# Patient Record
Sex: Female | Born: 1970 | Race: Black or African American | Hispanic: No | Marital: Single | Smoking: Never smoker
Health system: Southern US, Community
[De-identification: ages and names within clinical notes are randomized; demographics above are authoritative.]

## PROBLEM LIST (undated history)

## (undated) DIAGNOSIS — G43909 Migraine, unspecified, not intractable, without status migrainosus: Secondary | ICD-10-CM

## (undated) DIAGNOSIS — L309 Dermatitis, unspecified: Secondary | ICD-10-CM

## (undated) DIAGNOSIS — R7303 Prediabetes: Secondary | ICD-10-CM

## (undated) DIAGNOSIS — T7840XA Allergy, unspecified, initial encounter: Secondary | ICD-10-CM

## (undated) DIAGNOSIS — M1990 Unspecified osteoarthritis, unspecified site: Secondary | ICD-10-CM

## (undated) HISTORY — DX: Unspecified osteoarthritis, unspecified site: M19.90

## (undated) HISTORY — DX: Allergy, unspecified, initial encounter: T78.40XA

## (undated) HISTORY — DX: Prediabetes: R73.03

## (undated) HISTORY — DX: Dermatitis, unspecified: L30.9

## (undated) HISTORY — DX: Migraine, unspecified, not intractable, without status migrainosus: G43.909

## (undated) HISTORY — PX: BREAST BIOPSY: SHX20

## (undated) HISTORY — PX: SPINE SURGERY: SHX786

## (undated) HISTORY — PX: TUBAL LIGATION: SHX77

---

## 2017-08-28 LAB — HM HEPATITIS C SCREENING LAB: HM Hepatitis Screen: NEGATIVE

## 2017-08-28 LAB — HM HIV SCREENING LAB: HM HIV Screening: NEGATIVE

## 2020-09-03 LAB — HM PAP SMEAR: HM Pap smear: NORMAL

## 2020-09-25 ENCOUNTER — Other Ambulatory Visit: Payer: Self-pay | Admitting: Physician Assistant

## 2020-09-25 ENCOUNTER — Ambulatory Visit
Admission: RE | Admit: 2020-09-25 | Discharge: 2020-09-25 | Disposition: A | Discharge status: Home / Self Care | Payer: Managed Care, Other (non HMO) | Source: Ambulatory Visit | Attending: Physician Assistant | Admitting: Physician Assistant

## 2020-09-25 DIAGNOSIS — R053 Chronic cough: Secondary | ICD-10-CM

## 2021-03-20 ENCOUNTER — Emergency Department (HOSPITAL_COMMUNITY): Payer: Managed Care, Other (non HMO)

## 2021-03-20 ENCOUNTER — Emergency Department (HOSPITAL_COMMUNITY)
Admission: EM | Admit: 2021-03-20 | Discharge: 2021-03-21 | Disposition: A | Discharge status: Home / Self Care | Payer: Managed Care, Other (non HMO) | Attending: Emergency Medicine | Admitting: Emergency Medicine

## 2021-03-20 ENCOUNTER — Encounter (HOSPITAL_COMMUNITY): Payer: Self-pay

## 2021-03-20 ENCOUNTER — Other Ambulatory Visit: Payer: Self-pay

## 2021-03-20 DIAGNOSIS — R079 Chest pain, unspecified: Secondary | ICD-10-CM | POA: Diagnosis not present

## 2021-03-20 DIAGNOSIS — Z5321 Procedure and treatment not carried out due to patient leaving prior to being seen by health care provider: Secondary | ICD-10-CM | POA: Insufficient documentation

## 2021-03-20 DIAGNOSIS — R42 Dizziness and giddiness: Secondary | ICD-10-CM | POA: Insufficient documentation

## 2021-03-20 DIAGNOSIS — R202 Paresthesia of skin: Secondary | ICD-10-CM | POA: Diagnosis not present

## 2021-03-20 LAB — CBC
HCT: 41.1 % (ref 36.0–46.0)
Hemoglobin: 13.3 g/dL (ref 12.0–15.0)
MCH: 29 pg (ref 26.0–34.0)
MCHC: 32.4 g/dL (ref 30.0–36.0)
MCV: 89.7 fL (ref 80.0–100.0)
Platelets: 227 10*3/uL (ref 150–400)
RBC: 4.58 MIL/uL (ref 3.87–5.11)
RDW: 13.9 % (ref 11.5–15.5)
WBC: 7.4 10*3/uL (ref 4.0–10.5)
nRBC: 0 % (ref 0.0–0.2)

## 2021-03-20 LAB — I-STAT BETA HCG BLOOD, ED (MC, WL, AP ONLY): I-stat hCG, quantitative: <5 m[IU]/mL (ref <5)

## 2021-03-20 NOTE — ED Triage Notes (Signed)
Midsternal chest pain with dizziness, left foot tingling today but has been "going on for a while now."    Denies any pain.

## 2021-03-21 LAB — BASIC METABOLIC PANEL
Anion gap: 7 (ref 5–15)
BUN: 12 mg/dL (ref 6–20)
CO2: 26 mmol/L (ref 22–32)
Calcium: 8.9 mg/dL (ref 8.9–10.3)
Chloride: 103 mmol/L (ref 98–111)
Creatinine, Ser: 0.75 mg/dL (ref 0.44–1.00)
GFR, Estimated: >60 mL/min (ref >60)
Glucose, Bld: 84 mg/dL (ref 70–99)
Potassium: 4.2 mmol/L (ref 3.5–5.1)
Sodium: 136 mmol/L (ref 135–145)

## 2021-03-21 LAB — TROPONIN I (HIGH SENSITIVITY)
Troponin I (High Sensitivity): 3 ng/L (ref <18)
Troponin I (High Sensitivity): 5 ng/L (ref <18)

## 2021-03-21 NOTE — ED Notes (Signed)
Patient states d/t wait she is leaving

## 2021-03-26 ENCOUNTER — Other Ambulatory Visit: Payer: Self-pay

## 2021-03-26 ENCOUNTER — Encounter (HOSPITAL_BASED_OUTPATIENT_CLINIC_OR_DEPARTMENT_OTHER): Payer: Self-pay

## 2021-03-26 ENCOUNTER — Emergency Department (HOSPITAL_BASED_OUTPATIENT_CLINIC_OR_DEPARTMENT_OTHER)
Admission: EM | Admit: 2021-03-26 | Discharge: 2021-03-26 | Disposition: A | Discharge status: Home / Self Care | Payer: Managed Care, Other (non HMO) | Attending: Emergency Medicine | Admitting: Emergency Medicine

## 2021-03-26 DIAGNOSIS — R519 Headache, unspecified: Secondary | ICD-10-CM | POA: Diagnosis present

## 2021-03-26 DIAGNOSIS — G43809 Other migraine, not intractable, without status migrainosus: Secondary | ICD-10-CM

## 2021-03-26 MED ORDER — SODIUM CHLORIDE 0.9 % IV BOLUS
1000.0000 mL | Freq: Once | INTRAVENOUS | Status: AC
  Administered 2021-03-26: 1000 mL via INTRAVENOUS

## 2021-03-26 MED ORDER — DIPHENHYDRAMINE HCL 50 MG/ML IJ SOLN
25.0000 mg | Freq: Once | INTRAMUSCULAR | Status: AC
  Administered 2021-03-26: 25 mg via INTRAVENOUS
  Filled 2021-03-26: qty 1

## 2021-03-26 MED ORDER — PROCHLORPERAZINE EDISYLATE 10 MG/2ML IJ SOLN
10.0000 mg | Freq: Once | INTRAMUSCULAR | Status: AC
  Administered 2021-03-26: 10 mg via INTRAVENOUS
  Filled 2021-03-26: qty 2

## 2021-03-26 MED ORDER — DEXAMETHASONE SODIUM PHOSPHATE 10 MG/ML IJ SOLN
10.0000 mg | Freq: Once | INTRAMUSCULAR | Status: AC
  Administered 2021-03-26: 10 mg via INTRAVENOUS
  Filled 2021-03-26: qty 1

## 2021-03-26 NOTE — ED Triage Notes (Signed)
Pt reports headache that goes to her neck - onset this morning   Denies  dizzy / nausea / changes in vision / loss of balance

## 2021-03-26 NOTE — ED Provider Notes (Signed)
[facility] EMERGENCY DEPT Provider Note   CSN: [id number] Arrival date & time: 03/26/21  1938     History Chief Complaint  Patient presents with   Headache    Megan Frederick is a 50 y.o. female.  The history is provided by the patient.  Headache Pain location:  Generalized Radiates to:  Does not radiate Onset quality:  Gradual Timing:  Constant Progression:  Unchanged Chronicity:  Recurrent Similar to prior headaches: yes   Relieved by:  Nothing Worsened by:  Nothing Associated symptoms: no abdominal pain, no back pain, no blurred vision, no congestion, no cough, no diarrhea, no dizziness, no drainage, no ear pain, no eye pain, no facial pain, no fatigue, no fever, no focal weakness, no hearing loss, no loss of balance, no myalgias, no nausea, no near-syncope, no neck pain, no neck stiffness, no numbness, no paresthesias, no photophobia, no seizures, no sinus pressure, no sore throat, no swollen glands, no syncope, no tingling, no URI, no visual change, no vomiting and no weakness       History reviewed. No pertinent past medical history.  There are no problems to display for this patient.   Past Surgical History:  Procedure Laterality Date   TUBAL LIGATION       OB History   No obstetric history on file.     History reviewed. No pertinent family history.  Social History   Tobacco Use   Smoking status: Never   Smokeless tobacco: Never  Substance Use Topics   Alcohol use: Not Currently   Drug use: Never    Home Medications Prior to Admission medications   Not on File    Allergies    Patient has no known allergies.  Review of Systems   Review of Systems  Constitutional:  Negative for chills, fatigue and fever.  HENT:  Negative for congestion, ear pain, hearing loss, postnasal drip, sinus pressure and sore throat.   Eyes:  Negative for blurred vision, photophobia, pain and visual disturbance.  Respiratory:  Negative for cough  and shortness of breath.   Cardiovascular:  Negative for chest pain, palpitations, syncope and near-syncope.  Gastrointestinal:  Negative for abdominal pain, diarrhea, nausea and vomiting.  Genitourinary:  Negative for dysuria and hematuria.  Musculoskeletal:  Negative for arthralgias, back pain, myalgias, neck pain and neck stiffness.  Skin:  Negative for color change and rash.  Neurological:  Positive for headaches. Negative for dizziness, focal weakness, seizures, syncope, weakness, numbness, paresthesias and loss of balance.  All other systems reviewed and are negative.  Physical Exam Updated Vital Signs BP 124/78 (BP Location: Left Arm)   Pulse 70   Temp 98.2 F (36.8 C) (Oral)   Resp 18   Ht 5\' 5"  (1.651 m)   Wt 104.3 kg   SpO2 100%   BMI 38.27 kg/m   Physical Exam Vitals and nursing note reviewed.  Constitutional:      General: She is not in acute distress.    Appearance: She is well-developed. She is not ill-appearing.  HENT:     Head: Normocephalic and atraumatic.     Mouth/Throat:     Mouth: Mucous membranes are moist.  Eyes:     General: No visual field deficit.    Extraocular Movements: Extraocular movements intact.     Right eye: Normal extraocular motion.     Left eye: Normal extraocular motion.     Conjunctiva/sclera: Conjunctivae normal.     Pupils: Pupils are equal, round, and reactive to  light.  Cardiovascular:     Rate and Rhythm: Normal rate and regular rhythm.     Pulses: Normal pulses.     Heart sounds: Normal heart sounds. No murmur heard. Pulmonary:     Effort: Pulmonary effort is normal. No respiratory distress.     Breath sounds: Normal breath sounds.  Abdominal:     Palpations: Abdomen is soft.     Tenderness: There is no abdominal tenderness.  Musculoskeletal:     Cervical back: Neck supple.  Skin:    General: Skin is warm and dry.     Capillary Refill: Capillary refill takes less than 2 seconds.  Neurological:     General: No focal  deficit present.     Mental Status: She is alert and oriented to person, place, and time.     Cranial Nerves: No cranial nerve deficit, dysarthria or facial asymmetry.     Sensory: No sensory deficit.     Motor: No weakness.     Coordination: Coordination normal.    ED Results / Procedures / Treatments   Labs (all labs ordered are listed, but only abnormal results are displayed) Labs Reviewed - No data to display  EKG None  Radiology No results found.  Procedures Procedures   Medications Ordered in ED Medications  prochlorperazine (COMPAZINE) injection 10 mg (10 mg Intravenous Given 03/26/21 2219)  diphenhydrAMINE (BENADRYL) injection 25 mg (25 mg Intravenous Given 03/26/21 2219)  sodium chloride 0.9 % bolus 1,000 mL (1,000 mLs Intravenous New Bag/Given 03/26/21 2219)  dexamethasone (DECADRON) injection 10 mg (10 mg Intravenous Given 03/26/21 2219)    ED Course  I have reviewed the triage vital signs and the nursing notes.  Pertinent labs & imaging results that were available during my care of the patient were reviewed by me and considered in my medical decision making (see chart for details).    MDM Rules/Calculators/A&P                           Megan Frederick is here with headache.  Normal vitals.  No fever.  History of migraines.  No concern for meningitis or stroke.  Neurological exam is normal.  She denies any infectious symptoms.  No COVID symptoms.  She took ibuprofen without much relief.  Was given headache cocktail here with great improvement.  Overall suspect migraine.  Recommend continued use of Tylenol and ibuprofen at home.  Understands return precautions.  Discharged in the ED in good condition.  This chart was dictated using voice recognition software.  Despite best efforts to proofread,  errors can occur which can change the documentation meaning.   Final Clinical Impression(s) / ED Diagnoses Final diagnoses:  Other migraine without status  migrainosus, not intractable    Rx / DC Orders ED Discharge Orders     None        Virgina Norfolk, DO 03/26/21 2257

## 2021-07-18 ENCOUNTER — Encounter (HOSPITAL_BASED_OUTPATIENT_CLINIC_OR_DEPARTMENT_OTHER): Payer: Self-pay

## 2021-07-18 ENCOUNTER — Other Ambulatory Visit: Payer: Self-pay

## 2021-07-18 ENCOUNTER — Emergency Department (HOSPITAL_BASED_OUTPATIENT_CLINIC_OR_DEPARTMENT_OTHER)
Admission: EM | Admit: 2021-07-18 | Discharge: 2021-07-18 | Disposition: A | Discharge status: Home / Self Care | Payer: Managed Care, Other (non HMO) | Attending: Emergency Medicine | Admitting: Emergency Medicine

## 2021-07-18 DIAGNOSIS — J3489 Other specified disorders of nose and nasal sinuses: Secondary | ICD-10-CM | POA: Insufficient documentation

## 2021-07-18 DIAGNOSIS — J029 Acute pharyngitis, unspecified: Secondary | ICD-10-CM | POA: Diagnosis present

## 2021-07-18 DIAGNOSIS — Z20822 Contact with and (suspected) exposure to covid-19: Secondary | ICD-10-CM | POA: Insufficient documentation

## 2021-07-18 LAB — RESP PANEL BY RT-PCR (FLU A&B, COVID) ARPGX2
Influenza A by PCR: NEGATIVE
Influenza B by PCR: NEGATIVE
SARS Coronavirus 2 by RT PCR: NEGATIVE

## 2021-07-18 LAB — GROUP A STREP BY PCR: Group A Strep by PCR: NOT DETECTED

## 2021-07-18 NOTE — Discharge Instructions (Signed)
You are seen here today for evaluation of your sore throat.  Your COVID, flu, and strep test were negative.  This is likely viral in nature.  You have been referred to [facility] health family medicine.  Please call the number listed on this discharge report to schedule an appointment.  Please continue your home remedies such as your salt gargles and warm tea.  If you have any concern, new or worsening symptoms, please return to the nearest emergency department.

## 2021-07-18 NOTE — ED Triage Notes (Signed)
Patient here POV from Home with Neck Swelling.  Patient states Swelling began approximately 2-3 days PTA and has worsened since.  NAD Noted during Triage. Patient speaking in complete sentences. A&Ox4. GCS 15. Ambulatory. No Fevers. No Oral Airway Compromise at this Time.

## 2021-07-18 NOTE — ED Provider Notes (Signed)
[facility] EMERGENCY DEPT Provider Note   CSN: [id number] Arrival date & time: 07/18/21  1339     History Chief Complaint  Patient presents with   Neck Swelling    Iris Bugett Kellum is a 50 y.o. by 47 female presents to the emergency department for 2 days of sore throat, nasal congestion, rhinorrhea, and lymphadenopathy.  Patient reports she is having pain with swallowing of her throat.  She is finding mild relief with salt gargles and warm tea.  She denies any fever, chills, chest pain, shortness of breath, headache, ear pain, drooling, problems controlling her secretions.  Patient reports she has this unilateral lymphadenopathy around once per year for the past few years.  Denies any medical history.  Not pertinent surgical history.  No daily medications.  No known drug allergies.  Denies any tobacco, EtOH, or drug use.  HPI     History reviewed. No pertinent past medical history.  There are no problems to display for this patient.   Past Surgical History:  Procedure Laterality Date   TUBAL LIGATION       OB History   No obstetric history on file.     No family history on file.  Social History   Tobacco Use   Smoking status: Never   Smokeless tobacco: Never  Substance Use Topics   Alcohol use: Not Currently   Drug use: Never    Home Medications Prior to Admission medications   Medication Sig Start Date End Date Taking? Authorizing Provider  Ascorbic Acid (VITAMIN C) 100 MG tablet Take 100 mg by mouth daily.   Yes [provider]  Vitamin D, Ergocalciferol, (DRISDOL) 1.25 MG (50000 UNIT) CAPS capsule Take 50,000 Units by mouth See admin instructions. Once a week   Yes [provider]    Allergies    Patient has no known allergies.  Review of Systems   Review of Systems  Constitutional:  Negative for chills and fever.  HENT:  Positive for congestion, rhinorrhea and sore throat. Negative for ear discharge and ear pain.    Eyes:  Negative for pain and visual disturbance.  Respiratory:  Negative for cough and shortness of breath.   Cardiovascular:  Negative for chest pain and palpitations.  Gastrointestinal:  Negative for abdominal pain and vomiting.  Genitourinary:  Negative for dysuria and hematuria.  Musculoskeletal:  Negative for arthralgias and back pain.  Skin:  Negative for color change and rash.  Neurological:  Negative for seizures and syncope.  Hematological:  Positive for adenopathy.  All other systems reviewed and are negative.  Physical Exam Updated Vital Signs BP 115/74 (BP Location: Left Arm)   Pulse 85   Temp 98.1 F (36.7 C) (Oral)   Resp 16   Ht 5\' 5"  (1.651 m)   Wt 104.3 kg   SpO2 99%   BMI 38.26 kg/m   Physical Exam Constitutional:      General: She is not in acute distress.    Appearance: Normal appearance. She is not toxic-appearing.  HENT:     Right Ear: Tympanic membrane, ear canal and external ear normal.     Left Ear: Tympanic membrane, ear canal and external ear normal.     Nose:     Comments: bilateral nasal erythema and edema with scant clear nasal discharge    Mouth/Throat:     Mouth: Mucous membranes are moist.     Pharynx: Oropharynx is clear. No oropharyngeal exudate.     Comments: Mild pharyngeal erythema,  without edema or exudate.  Tonsils normal.  Uvula midline.  Airway patent.  Patient speaking in full sentences with ease. Eyes:     General: No scleral icterus. Neck:     Comments: Small lymph node less than 2 cm to the patient's left submandibular region.  Mildly tender to palpation. Pulmonary:     Effort: Pulmonary effort is normal. No respiratory distress.  Musculoskeletal:     Cervical back: Normal range of motion. No rigidity.  Lymphadenopathy:     Cervical: Cervical adenopathy present.  Skin:    General: Skin is dry.     Findings: No rash.  Neurological:     General: No focal deficit present.     Mental Status: She is alert. Mental status is  at baseline.  Psychiatric:        Mood and Affect: Mood normal.    ED Results / Procedures / Treatments   Labs (all labs ordered are listed, but only abnormal results are displayed) Labs Reviewed  RESP PANEL BY RT-PCR (FLU A&B, COVID) ARPGX2  GROUP A STREP BY PCR    EKG None  Radiology No results found.  Procedures Procedures   Medications Ordered in ED Medications - No data to display  ED Course  I have reviewed the triage vital signs and the nursing notes.  Pertinent labs & imaging results that were available during my care of the patient were reviewed by me and considered in my medical decision making (see chart for details).  50 year old female presents to the emergency department with nasal congestion, rhinorrhea, and sore throat with lymphadenopathy for the past 2 days.  Patient had a negative COVID, flu, and strep.  I discussed with patient that this issue is likely viral in nature and will not require an antibiotic.  However, since she has this lymphadenopathy periodically over the past few years, I recommended that she follow-up with her primary care provider for further evaluation.  Physical exam unremarkable other than mild pharyngeal erythema.  Patient speaking in full sentence with ease.  Controlling secretions.  Full range of motion in neck.  We will refer her to primary care clinic.  Furthermore, I recommended that she keep up with her homeopathic remedies such as gargling with salt water and drinking warm tea.  Return precautions discussed.  Patient agrees to plan.  Patient is stable and being discharged home in good condition.    MDM Rules/Calculators/A&P                          Final Clinical Impression(s) / ED Diagnoses Final diagnoses:  Sore throat    Rx / DC Orders ED Discharge Orders     None        Achille Rich, PA-C 07/18/21 1633    Terald Sleeper, MD 07/19/21 [phone]

## 2021-09-19 ENCOUNTER — Ambulatory Visit: Payer: Managed Care, Other (non HMO) | Admitting: Allergy & Immunology

## 2021-11-05 ENCOUNTER — Emergency Department (HOSPITAL_BASED_OUTPATIENT_CLINIC_OR_DEPARTMENT_OTHER): Payer: Managed Care, Other (non HMO) | Admitting: Radiology

## 2021-11-05 ENCOUNTER — Other Ambulatory Visit: Payer: Self-pay

## 2021-11-05 ENCOUNTER — Emergency Department (HOSPITAL_BASED_OUTPATIENT_CLINIC_OR_DEPARTMENT_OTHER)
Admission: EM | Admit: 2021-11-05 | Discharge: 2021-11-05 | Disposition: A | Discharge status: Home / Self Care | Payer: Managed Care, Other (non HMO) | Attending: Emergency Medicine | Admitting: Emergency Medicine

## 2021-11-05 ENCOUNTER — Emergency Department (HOSPITAL_BASED_OUTPATIENT_CLINIC_OR_DEPARTMENT_OTHER): Payer: Managed Care, Other (non HMO)

## 2021-11-05 ENCOUNTER — Encounter (HOSPITAL_BASED_OUTPATIENT_CLINIC_OR_DEPARTMENT_OTHER): Payer: Self-pay

## 2021-11-05 DIAGNOSIS — R42 Dizziness and giddiness: Secondary | ICD-10-CM | POA: Insufficient documentation

## 2021-11-05 DIAGNOSIS — R202 Paresthesia of skin: Secondary | ICD-10-CM | POA: Diagnosis not present

## 2021-11-05 DIAGNOSIS — R200 Anesthesia of skin: Secondary | ICD-10-CM

## 2021-11-05 LAB — BASIC METABOLIC PANEL
Anion gap: 9 (ref 5–15)
BUN: 15 mg/dL (ref 6–20)
CO2: 27 mmol/L (ref 22–32)
Calcium: 9.2 mg/dL (ref 8.9–10.3)
Chloride: 105 mmol/L (ref 98–111)
Creatinine, Ser: 0.67 mg/dL (ref 0.44–1.00)
GFR, Estimated: >60 mL/min (ref >60)
Glucose, Bld: 113 mg/dL — ABNORMAL HIGH (ref 70–99)
Potassium: 3.8 mmol/L (ref 3.5–5.1)
Sodium: 141 mmol/L (ref 135–145)

## 2021-11-05 LAB — CBC
HCT: 39.1 % (ref 36.0–46.0)
Hemoglobin: 12.9 g/dL (ref 12.0–15.0)
MCH: 28.7 pg (ref 26.0–34.0)
MCHC: 33 g/dL (ref 30.0–36.0)
MCV: 86.9 fL (ref 80.0–100.0)
Platelets: 225 10*3/uL (ref 150–400)
RBC: 4.5 MIL/uL (ref 3.87–5.11)
RDW: 14.3 % (ref 11.5–15.5)
WBC: 6.6 10*3/uL (ref 4.0–10.5)
nRBC: 0 % (ref 0.0–0.2)

## 2021-11-05 LAB — TROPONIN I (HIGH SENSITIVITY): Troponin I (High Sensitivity): 4 ng/L (ref <18)

## 2021-11-05 NOTE — ED Notes (Signed)
Pt verbalizes understanding of discharge instructions. Opportunity for questioning and answers were provided. Pt discharged from ED to home.   ? ?

## 2021-11-05 NOTE — ED Provider Notes (Signed)
?[facility]PT ?Provider Note ? ? ?CSN: [id number] ?Arrival date & time: 11/05/21  0215 ? ?  ? ?History ? ?Chief Complaint  ?Patient presents with  ? Dizziness  ? ? ?Megan Frederick is a 51 y.o. female. ? ?Patient is a 51 year old female with no significant past medical history.  She presents today for evaluation of tingling in her left hand and dizziness.  This started yesterday evening in the absence of any injury or trauma.  She denies any headache, visual disturbances, or any tingling in her legs.  She denies any chest pain, difficulty breathing.  There are no aggravating or alleviating factors.  She also reports feeling light headed on occasion as well. ? ?The history is provided by the patient.  ?Dizziness ?Quality:  Lightheadedness ?Severity:  Moderate ?Timing:  Intermittent ?Progression:  Worsening ?Chronicity:  New ? ?  ? ?Home Medications ?Prior to Admission medications   ?Medication Sig Start Date End Date Taking? Authorizing Provider  ?Ascorbic Acid (VITAMIN C) 100 MG tablet Take 100 mg by mouth daily.    [provider]  ?Vitamin D, Ergocalciferol, (DRISDOL) 1.25 MG (50000 UNIT) CAPS capsule Take 50,000 Units by mouth See admin instructions. Once a week    [provider]  ?   ? ?Allergies    ?Patient has no known allergies.   ? ?Review of Systems   ?Review of Systems  ?Neurological:  Positive for dizziness.  ?All other systems reviewed and are negative. ? ?Physical Exam ?Updated Vital Signs ?BP 104/69   Pulse 71   Temp 98.3 ?F (36.8 ?C)   Resp 18   Ht 5\' 5"  (1.651 m)   Wt 104.3 kg   SpO2 98%   BMI 38.27 kg/m?  ?Physical Exam ?Vitals and nursing note reviewed.  ?Constitutional:   ?   General: She is not in acute distress. ?   Appearance: She is well-developed. She is not diaphoretic.  ?HENT:  ?   Head: Normocephalic and atraumatic.  ?   Mouth/Throat:  ?   Mouth: Mucous membranes are moist.  ?Eyes:  ?   Extraocular Movements: Extraocular movements  intact.  ?   Pupils: Pupils are equal, round, and reactive to light.  ?Cardiovascular:  ?   Rate and Rhythm: Normal rate and regular rhythm.  ?   Heart sounds: No murmur heard. ?  No friction rub. No gallop.  ?Pulmonary:  ?   Effort: Pulmonary effort is normal. No respiratory distress.  ?   Breath sounds: Normal breath sounds. No wheezing.  ?Abdominal:  ?   General: Bowel sounds are normal. There is no distension.  ?   Palpations: Abdomen is soft.  ?   Tenderness: There is no abdominal tenderness.  ?Musculoskeletal:     ?   General: Normal range of motion.  ?   Cervical back: Normal range of motion and neck supple.  ?Skin: ?   General: Skin is warm and dry.  ?Neurological:  ?   General: No focal deficit present.  ?   Mental Status: She is alert and oriented to person, place, and time. Mental status is at baseline.  ?   Cranial Nerves: No cranial nerve deficit.  ?   Sensory: No sensory deficit.  ?   Motor: No weakness.  ?   Coordination: Coordination normal.  ?   Gait: Gait normal.  ? ? ?ED Results / Procedures / Treatments   ?Labs ?(all labs ordered are listed, but only abnormal results are  displayed) ?Labs Reviewed  ?BASIC METABOLIC PANEL - Abnormal; Notable for the following components:  ?    Result Value  ? Glucose, Bld 113 (*)   ? All other components within normal limits  ?CBC  ?PREGNANCY, URINE  ?TROPONIN I (HIGH SENSITIVITY)  ? ? ?EKG ?EKG Interpretation ? ?Date/Time:  Tuesday November 05 2021 02:29:49 EST ?Ventricular Rate:  66 ?PR Interval:  168 ?QRS Duration: 90 ?QT Interval:  418 ?QTC Calculation: 438 ?R Axis:   57 ?Text Interpretation: Normal sinus rhythm Nonspecific T wave abnormality Abnormal ECG When compared with ECG of 20-Mar-2021 21:45, Sinus rhythm has replaced Ectopic atrial rhythm Confirmed by Geoffery Lyons ([id number]) on 11/05/2021 3:27:18 AM ? ?Radiology ?DG Chest 2 View ? ?Result Date: 11/05/2021 ?CLINICAL DATA:  Bilateral arm pain. EXAM: CHEST - 2 VIEW COMPARISON:  March 20, 2021 FINDINGS: The heart  size and mediastinal contours are within normal limits. Both lungs are clear. The visualized skeletal structures are unremarkable. IMPRESSION: No active cardiopulmonary disease. Electronically Signed   By: Aram Candela M.D.   On: 11/05/2021 02:53   ? ?Procedures ?Procedures  ? ? ?Medications Ordered in ED ?Medications - No data to display ? ?ED Course/ Medical Decision Making/ A&P ? ?This patient presents to the ED for concern of left hand tingling, this involves an extensive number of treatment options, and is a complaint that carries with it a high risk of complications and morbidity.  The differential diagnosis includes cervical radiculopathy, acute CVA, anxiety, nerve impingement ? ? ?Co morbidities that complicate the patient evaluation ? ?None ? ? ?Additional history obtained: ? ?No additional history or external records ? ? ?Lab Tests: ? ?I Ordered, and personally interpreted labs.  The pertinent results include: Unremarkable CBC, basic metabolic panel, and troponin ? ? ?Imaging Studies ordered: ? ?I ordered imaging studies including CT scan of the head ?I independently visualized and interpreted imaging which showed no acute process ?I agree with the radiologist interpretation ? ? ?Cardiac Monitoring: ? ?The patient was maintained on a cardiac monitor.  I personally viewed and interpreted the cardiac monitored which showed an underlying rhythm of: Sinus ? ? ?Medicines ordered and prescription drug management: ? ?New medications given ?I have reviewed the patients home medicines and have made adjustments as needed ? ? ?Test Considered: ? ?None ? ? ? ?Critical Interventions: ? ?None ? ? ? ?Consultations Obtained: ? ?No consultations obtained ? ? ?Problem List / ED Course: ? ?Patient presenting with tingling in her left hand and feeling dizzy.  I am uncertain as to the etiology of the symptoms, however this is not consistent with acute CVA.  Patient could potentially have an impingement syndrome or  cervical radiculopathy, but she has no strength deficits. ?Work-up today is unremarkable including laboratory studies, EKG, and CT scan of the head.  I feel as though patient can safely be discharged with outpatient follow-up. ? ? ? ?Social Determinants of Health: ? ?None ? ? ? ? ?Final Clinical Impression(s) / ED Diagnoses ?Final diagnoses:  ?None  ? ? ?Rx / DC Orders ?ED Discharge Orders   ? ? None  ? ?  ? ? ?  ?Geoffery Lyons, MD ?11/05/21 0430 ? ?

## 2021-11-05 NOTE — Discharge Instructions (Signed)
Follow-up with your primary doctor if your symptoms or not improving in the next few days. ? ?Return to the ER if symptoms significantly worsen or change. ?

## 2021-11-05 NOTE — ED Triage Notes (Signed)
Dizziness starting tonight with bilateral upper arm that started yesterday evening. ?

## 2021-11-21 ENCOUNTER — Encounter: Payer: Self-pay | Admitting: Allergy & Immunology

## 2021-11-21 ENCOUNTER — Other Ambulatory Visit: Payer: Self-pay

## 2021-11-21 ENCOUNTER — Ambulatory Visit: Payer: Managed Care, Other (non HMO) | Admitting: Allergy & Immunology

## 2021-11-21 VITALS — BP 110/70 | HR 87 | Temp 97.6°F | Resp 16 | Ht 65.0 in | Wt 224.4 lb

## 2021-11-21 DIAGNOSIS — B999 Unspecified infectious disease: Secondary | ICD-10-CM | POA: Diagnosis not present

## 2021-11-21 DIAGNOSIS — J310 Chronic rhinitis: Secondary | ICD-10-CM | POA: Insufficient documentation

## 2021-11-21 DIAGNOSIS — R053 Chronic cough: Secondary | ICD-10-CM | POA: Insufficient documentation

## 2021-11-21 MED ORDER — MOMETASONE FUROATE 0.1 % EX CREA: 1.0000 "application " | TOPICAL_CREAM | Freq: Every day | CUTANEOUS | 3 refills | Status: DC

## 2021-11-21 NOTE — Patient Instructions (Addendum)
1. Chronic rhinitis ?- Testing today showed: NEGATIVE TO THE ENTIRE PANEL ?- Copy of test results provided.  ?- I do not think that we need to add anything for your symptoms at this point in time. ?- We could do more aggressive sensitive testing including intradermal (injections under the skin) in the future if we need to, but your symptoms do not seem severe enough to warrant this at this point in time.  ? ?2. Recurrent infections ?- We will obtain some screening labs to evaluate your immune system.  ?- Labs to evaluate the quantitative [facility]) aspects of your immune system: IgG/IgA/IgM, CBC with differential ?- Labs to evaluate the qualitative (HOW WELL THEY WORK) aspects of your immune system: CH50, Pneumococcal titers, Tetanus titers, Diphtheria titers ?- We may consider immunizations with Pneumovax and Tdap to challenge your immune system, and then obtain repeat titers in 4-6 weeks.  ? ?3. Chronic cough ?- We did not do a spirometry since your cough resolved. ?- We can look into this more if the cough gets bad again.  ?- I did not want to waste your time and money doing this today. ? ?4. Return in about 6 months (around 05/24/2022).  ? ? ?Please inform us of any Emergency Department visits, hospitalizations, or changes in symptoms. Call us before going to the ED for breathing or allergy symptoms since we might be able to fit you in for a sick visit. Feel free to contact us anytime with any questions, problems, or concerns. ? ?It was a pleasure to see you again today! ? ?Websites that have reliable patient information: ?1. American Academy of Asthma, Allergy, and Immunology: www.aaaai.org ?2. Food Allergy Research and Education (FARE): foodallergy.org ?3. Mothers of Asthmatics: http://www.asthmacommunitynetwork.org ?4. [employer] of Allergy, Asthma, and Immunology: MissingWeapons.ca ? ? ?COVID-19 Vaccine Information can be found at:  PodExchange.nl For questions related to vaccine distribution or appointments, please email [email or web address] or call [phone].  ? ?We realize that you might be concerned about having an allergic reaction to the COVID19 vaccines. To help with that concern, WE ARE OFFERING THE COVID19 VACCINES IN OUR OFFICE! Ask the front desk for dates!  ? ? ? ??Like? [place] on Facebook and Instagram for our latest updates!  ?  ? ? ?A healthy democracy works best when [employer] participate! Make sure you are registered to vote! If you have moved or changed any of your contact information, you will need to get this updated before voting! ? ?In some cases, you MAY be able to register to vote online: [email or web address] ? ? ? ? ? ? Airborne Adult Perc - 11/21/21 1018   ? ? Time Antigen Placed 1000   ? Allergen Manufacturer Waynette Buttery   ? Location Back   ? Number of Test 59   ? 1. Control-Buffer 50% Glycerol Negative   ? 2. Control-Histamine 1 mg/ml 2+   ? 3. Albumin saline Negative   ? 4. Bahia Negative   ? 5. French Southern Territories Negative   ? 6. Johnson Negative   ? 7. Kentucky Blue Negative   ? 8. Meadow Fescue Negative   ? 9. Perennial Rye Negative   ? 10. Sweet Vernal Negative   ? 11. Timothy Negative   ? 12. Cocklebur Negative   ? 13. Burweed Marshelder Negative   ? 14. Ragweed, short Negative   ? 15. Ragweed, Giant Negative   ? 16. Plantain,  English Negative   ? 17. Lamb's Quarters Negative   ? 18. Sheep  Sorrell Negative   ? 19. Rough Pigweed Negative   ? 20. Marsh Elder, Rough Negative   ? 21. Mugwort, Common Negative   ? 22. Ash mix Negative   ? 23. Charletta Cousin mix Negative   ? 24. Beech American Negative   ? 25. Box, Elder Negative   ? 26. Cedar, red Negative   ? 27. Cottonwood, [place] Negative   ? 28. Elm mix Negative   ? 29. Hickory Negative   ? 30. Maple mix Negative   ? 31. Oak, [place] mix Negative   ? 32. Pecan Pollen Negative   ? 33. Pine mix  Negative   ? 34. Sycamore Eastern Negative   ? 35. Walnut, Black Pollen Negative   ? 36. Alternaria alternata Negative   ? 37. Cladosporium Herbarum Negative   ? 38. Aspergillus mix Negative   ? 39. Penicillium mix Negative   ? 40. Bipolaris sorokiniana (Helminthosporium) Negative   ? 41. Drechslera spicifera (Curvularia) Negative   ? 42. Mucor plumbeus Negative   ? 43. Fusarium moniliforme Negative   ? 44. Aureobasidium pullulans (pullulara) Negative   ? 45. Rhizopus oryzae Negative   ? 46. Botrytis cinera Negative   ? 47. Epicoccum nigrum Negative   ? 48. Phoma betae Negative   ? 49. Candida Albicans Negative   ? 50. Trichophyton mentagrophytes Negative   ? 51. Mite, D Farinae  5,000 AU/ml Negative   ? 52. Mite, D Pteronyssinus  5,000 AU/ml Negative   ? 53. Cat Hair 10,000 BAU/ml Negative   ? 54.  Dog Epithelia Negative   ? 55. Mixed Feathers Negative   ? 56. Horse Epithelia Negative   ? 57. Cockroach, [place] Negative   ? 58. Mouse Negative   ? 59. Tobacco Leaf Negative   ? ?  ?  ? ?  ? ? ? ? ? ? ? ?

## 2021-11-21 NOTE — Progress Notes (Signed)
NEW PATIENT  Date of Service/Encounter:  11/21/21  Consult requested by: Pcp, No   Assessment:   Chronic nonallergic rhinitis  Recurrent infections - getting lab workup today  Chronic cough  Social upheaval - with divorce and raising her grand kids  Plan/Recommendations:    1. Chronic rhinitis - Testing today showed: NEGATIVE TO THE ENTIRE PANEL - Copy of test results provided.  - I do not think that we need to add anything for your symptoms at this point in time. - We could do more aggressive sensitive testing including intradermal (injections under the skin) in the future if we need to, but your symptoms do not seem severe enough to warrant this at this point in time.   2. Recurrent infections - We will obtain some screening labs to evaluate your immune system.  - Labs to evaluate the quantitative [facility]) aspects of your immune system: IgG/IgA/IgM, CBC with differential - Labs to evaluate the qualitative (HOW WELL THEY WORK) aspects of your immune system: CH50, Pneumococcal titers, Tetanus titers, Diphtheria titers - We may consider immunizations with Pneumovax and Tdap to challenge your immune system, and then obtain repeat titers in 4-6 weeks.   3. Chronic cough - We did not do a spirometry since your cough resolved. - We can look into this more if the cough gets bad again.  - I did not want to waste your time and money doing this today.  4. Return in about 6 months (around 05/24/2022).     This note in its entirety was forwarded to the Provider who requested this consultation.  Subjective:   Megan Frederick is a 51 y.o. female presenting today for evaluation of  Chief Complaint  Patient presents with   Allergy Testing    Megan Frederick has a history of the following: Patient Active Problem List   Diagnosis Date Noted   Chronic rhinitis 11/21/2021   Recurrent infections 11/21/2021   Chronic cough 11/21/2021    History obtained from:  chart review and patient.  Megan Frederick was referred by Pcp, No.     [name] is a 51 y.o. female presenting for an evaluation of chronic cough and environmental allergies . She had to reschedule because her mother passed away at 45. 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    Asthma/Respiratory Symptom History: She was initially referred due to a cough. She ended up taking care of her grand kids, so she thinks that the recurrent illnesses combined with stress lead to the coughing. She is unsure what it was consistent. It cleared up in December when she got sick with a common cold. This was all it was with a low grade fever. She was off for Christmas and she was in her bed sick, but she realized that she was no longer coughing. She is enjoying this break because she is no longer coughing.   Allergic Rhinitis Symptom History: She does report some intermittent sneezing and itchy watery eyes. This is very much better than it was when she was younger. She does not take anything daily for her allergies. She gets sinus infections around once per month. Now she uses nasal saline rinses when she has flares and this helps her to avoid medications including antibiotics and prednisone. Her last sinus infection was when she was sick with that cold.   Skin Symptom History: She has a history of eczema that  is worse on the right hand. She thinks that the eczema has been around for 3-4 years. This has largely cleared up. She uses the mometasone cream. She uses a little and it clears up for a couple of days.   She is focusing on herself now and has a therapist. She is having a lot of social issues including a recent divorce.   Otherwise, there is no history of other atopic diseases, including food allergies, drug allergies, stinging insect allergies, urticaria, or contact dermatitis. There is no significant infectious history. Vaccinations are  up to date.    Past Medical History: Patient Active Problem List   Diagnosis Date Noted   Chronic rhinitis 11/21/2021   Recurrent infections 11/21/2021   Chronic cough 11/21/2021    Medication List:  Allergies as of 11/21/2021   No Known Allergies      Medication List        Accurate as of November 21, 2021  1:26 PM. If you have any questions, ask your nurse or doctor.          mometasone 0.1 % cream Commonly known as: Elocon Apply 1 application. topically daily. Started by: Alfonse Spruce, MD   TYLENOL PO Take by mouth as needed. Extra strength   TYLENOL SINUS SEVERE PO Take by mouth as needed.   vitamin C 100 MG tablet Take 100 mg by mouth daily.   Vitamin D (Ergocalciferol) 1.25 MG (50000 UNIT) Caps capsule Commonly known as: DRISDOL Take 50,000 Units by mouth See admin instructions. Once a week        Birth History: non-contributory  Developmental History: non-contributory  Past Surgical History: Past Surgical History:  Procedure Laterality Date   TUBAL LIGATION       Family History: Family History  Problem Relation Age of Onset   Eczema Grandson    Asthma Grandson    Allergic rhinitis Grandson    Allergic rhinitis Granddaughter    Asthma Granddaughter    Asthma Daughter    Allergic rhinitis Daughter      Social History: [name] lives at home with her grandkids.  They live in an apartment that is around 53 years old.  There is wood and carpet in the main living areas and carpeting in the bedroom.  She has electric heating and central cooling.  There is minimal inside or outside of the home.  There are no dust mite covers on the bedding.  There is no tobacco exposure.  She currently works as an Airline pilot in [employer].  She has been there for just over 2 years.  She works from home.  She does not have a HEPA filter.   Review of Systems  Constitutional:  Negative for chills, fever, malaise/fatigue and weight loss.  HENT:   Positive for congestion. Negative for ear discharge and ear pain.   Eyes:  Negative for pain, discharge and redness.  Respiratory:  Negative for cough, sputum production, shortness of breath and wheezing.   Cardiovascular: Negative.  Negative for chest pain and palpitations.  Gastrointestinal:  Negative for abdominal pain, blood in stool, diarrhea, heartburn, nausea and vomiting.  Skin: Negative.  Negative for itching and rash.  Neurological:  Negative for dizziness and headaches.  Endo/Heme/Allergies:  Positive for environmental allergies. Does not bruise/bleed easily.      Objective:   Blood pressure 110/70, pulse 87, temperature 97.6 F (36.4 C), temperature source Temporal, resp. rate 16, height 5\' 5"  (1.651 m), weight 224 lb 6.4 oz (101.8 kg),  SpO2 97 %. Body mass index is 37.34 kg/m.     Physical Exam Vitals reviewed.  Constitutional:      Appearance: She is well-developed.     Comments: Pleasant and talkative.  HENT:     Head: Normocephalic and atraumatic.     Right Ear: Tympanic membrane, ear canal and external ear normal. No drainage, swelling or tenderness. Tympanic membrane is not injected, scarred, erythematous, retracted or bulging.     Left Ear: Tympanic membrane, ear canal and external ear normal. No drainage, swelling or tenderness. Tympanic membrane is not injected, scarred, erythematous, retracted or bulging.     Nose: No nasal deformity, septal deviation, mucosal edema or rhinorrhea.     Right Sinus: No maxillary sinus tenderness or frontal sinus tenderness.     Left Sinus: No maxillary sinus tenderness or frontal sinus tenderness.     Mouth/Throat:     Mouth: Mucous membranes are not pale and not dry.     Pharynx: Uvula midline.  Eyes:     General:        Right eye: No discharge.        Left eye: No discharge.     Conjunctiva/sclera: Conjunctivae normal.     Right eye: Right conjunctiva is not injected. No chemosis.    Left eye: Left conjunctiva is not  injected. No chemosis.    Pupils: Pupils are equal, round, and reactive to light.  Cardiovascular:     Rate and Rhythm: Normal rate and regular rhythm.     Heart sounds: Normal heart sounds.  Pulmonary:     Effort: Pulmonary effort is normal. No tachypnea, accessory muscle usage or respiratory distress.     Breath sounds: Normal breath sounds. No wheezing, rhonchi or rales.     Comments: Moving air well in all lung fields. Chest:     Chest wall: No tenderness.  Abdominal:     Tenderness: There is no abdominal tenderness. There is no guarding or rebound.  Lymphadenopathy:     Head:     Right side of head: No submandibular, tonsillar or occipital adenopathy.     Left side of head: No submandibular, tonsillar or occipital adenopathy.     Cervical: No cervical adenopathy.  Skin:    General: Skin is warm.     Capillary Refill: Capillary refill takes less than 2 seconds.     Coloration: Skin is not pale.     Findings: No abrasion, erythema, petechiae or rash. Rash is not papular, urticarial or vesicular.     Comments: No eczematous or urticarial lesions noted.  She does have some dry skin in her right hand, otherwise normal.  Neurological:     Mental Status: She is alert.  Psychiatric:        Behavior: Behavior is cooperative.     Diagnostic studies:   Allergy Studies:     Airborne Adult Perc - 11/21/21 1018     Time Antigen Placed 1000    Allergen Manufacturer Waynette Buttery    Location Back    Number of Test 59    1. Control-Buffer 50% Glycerol Negative    2. Control-Histamine 1 mg/ml 2+    3. Albumin saline Negative    4. Bahia Negative    5. French Southern Territories Negative    6. Johnson Negative    7. Kentucky Blue Negative    8. Meadow Fescue Negative    9. Perennial Rye Negative    10. Sweet Vernal Negative    11. Marcial Pacas  Negative    12. Cocklebur Negative    13. Burweed Marshelder Negative    14. Ragweed, short Negative    15. Ragweed, Giant Negative    16. Plantain,  English Negative     17. Lamb's Quarters Negative    18. Sheep Sorrell Negative    19. Rough Pigweed Negative    20. Marsh Elder, Rough Negative    21. Mugwort, Common Negative    22. Ash mix Negative    23. Birch mix Negative    24. Beech American Negative    25. Box, Elder Negative    26. Cedar, red Negative    27. Cottonwood, [place] Negative    28. Elm mix Negative    29. Hickory Negative    30. Maple mix Negative    31. Oak, [place] mix Negative    32. Pecan Pollen Negative    33. Pine mix Negative    34. Sycamore Eastern Negative    35. Walnut, Black Pollen Negative    36. Alternaria alternata Negative    37. Cladosporium Herbarum Negative    38. Aspergillus mix Negative    39. Penicillium mix Negative    40. Bipolaris sorokiniana (Helminthosporium) Negative    41. Drechslera spicifera (Curvularia) Negative    42. Mucor plumbeus Negative    43. Fusarium moniliforme Negative    44. Aureobasidium pullulans (pullulara) Negative    45. Rhizopus oryzae Negative    46. Botrytis cinera Negative    47. Epicoccum nigrum Negative    48. Phoma betae Negative    49. Candida Albicans Negative    50. Trichophyton mentagrophytes Negative    51. Mite, D Farinae  5,000 AU/ml Negative    52. Mite, D Pteronyssinus  5,000 AU/ml Negative    53. Cat Hair 10,000 BAU/ml Negative    54.  Dog Epithelia Negative    55. Mixed Feathers Negative    56. Horse Epithelia Negative    57. Cockroach, [place] Negative    58. Mouse Negative    59. Tobacco Leaf Negative             Allergy testing results were read and interpreted by myself, documented by clinical staff.         Malachi Bonds, MD [facility]

## 2021-11-22 NOTE — Addendum Note (Signed)
Addended by: Eloy End D on: 11/22/2021 10:53 AM ? ? Modules accepted: Orders ? ?

## 2021-11-26 NOTE — Addendum Note (Signed)
Addended by: Briah Nary LOUIS on: 11/26/2021 12:26 PM ? ? Modules accepted: Orders ? ?

## 2021-11-27 LAB — CBC WITH DIFFERENTIAL
Basophils Absolute: 0.1 10*3/uL (ref 0.0–0.2)
Basos: 1 %
EOS (ABSOLUTE): 0.1 10*3/uL (ref 0.0–0.4)
Eos: 2 %
Hematocrit: 40.7 % (ref 34.0–46.6)
Hemoglobin: 13.8 g/dL (ref 11.1–15.9)
Immature Grans (Abs): 0 10*3/uL (ref 0.0–0.1)
Immature Granulocytes: 0 %
Lymphocytes Absolute: 2.1 10*3/uL (ref 0.7–3.1)
Lymphs: 40 %
MCH: 29 pg (ref 26.6–33.0)
MCHC: 33.9 g/dL (ref 31.5–35.7)
MCV: 86 fL (ref 79–97)
Monocytes Absolute: 0.5 10*3/uL (ref 0.1–0.9)
Monocytes: 10 %
Neutrophils Absolute: 2.5 10*3/uL (ref 1.4–7.0)
Neutrophils: 47 %
RBC: 4.76 x10E6/uL (ref 3.77–5.28)
RDW: 13.4 % (ref 11.7–15.4)
WBC: 5.2 10*3/uL (ref 3.4–10.8)

## 2021-11-27 LAB — STREP PNEUMONIAE 23 SEROTYPES IGG
Pneumo Ab Type 1*: 0.9 ug/mL — ABNORMAL LOW (ref >1.3)
Pneumo Ab Type 12 (12F)*: <0.1 ug/mL — ABNORMAL LOW (ref >1.3)
Pneumo Ab Type 14*: 0.7 ug/mL — ABNORMAL LOW (ref >1.3)
Pneumo Ab Type 17 (17F)*: 0.7 ug/mL — ABNORMAL LOW (ref >1.3)
Pneumo Ab Type 19 (19F)*: 3.5 ug/mL (ref >1.3)
Pneumo Ab Type 2*: 0.8 ug/mL — ABNORMAL LOW (ref >1.3)
Pneumo Ab Type 20*: 4.4 ug/mL (ref >1.3)
Pneumo Ab Type 22 (22F)*: 0.6 ug/mL — ABNORMAL LOW (ref >1.3)
Pneumo Ab Type 23 (23F)*: 0.1 ug/mL — ABNORMAL LOW (ref >1.3)
Pneumo Ab Type 26 (6B)*: <0.1 ug/mL — ABNORMAL LOW (ref >1.3)
Pneumo Ab Type 3*: 0.4 ug/mL — ABNORMAL LOW (ref >1.3)
Pneumo Ab Type 34 (10A)*: 1.8 ug/mL (ref >1.3)
Pneumo Ab Type 4*: 0.1 ug/mL — ABNORMAL LOW (ref >1.3)
Pneumo Ab Type 43 (11A)*: 0.6 ug/mL — ABNORMAL LOW (ref >1.3)
Pneumo Ab Type 5*: <0.1 ug/mL — ABNORMAL LOW (ref >1.3)
Pneumo Ab Type 51 (7F)*: 0.3 ug/mL — ABNORMAL LOW (ref >1.3)
Pneumo Ab Type 54 (15B)*: 1 ug/mL — ABNORMAL LOW (ref >1.3)
Pneumo Ab Type 56 (18C)*: <0.1 ug/mL — ABNORMAL LOW (ref >1.3)
Pneumo Ab Type 57 (19A)*: 1 ug/mL — ABNORMAL LOW (ref >1.3)
Pneumo Ab Type 68 (9V)*: 0.2 ug/mL — ABNORMAL LOW (ref >1.3)
Pneumo Ab Type 70 (33F)*: 2 ug/mL (ref >1.3)
Pneumo Ab Type 8*: 0.2 ug/mL — ABNORMAL LOW (ref >1.3)
Pneumo Ab Type 9 (9N)*: 0.7 ug/mL — ABNORMAL LOW (ref >1.3)

## 2021-11-27 LAB — IGG, IGA, IGM
IgA/Immunoglobulin A, Serum: 556 mg/dL — ABNORMAL HIGH (ref 87–352)
IgG (Immunoglobin G), Serum: 1933 mg/dL — ABNORMAL HIGH (ref 586–1602)
IgM (Immunoglobulin M), Srm: 61 mg/dL (ref 26–217)

## 2021-11-27 LAB — DIPHTHERIA / TETANUS ANTIBODY PANEL
Diphtheria Ab: 0.24 IU/mL (ref <0.10)
Tetanus Ab, IgG: 0.14 IU/mL (ref <0.10)

## 2021-11-27 LAB — COMPLEMENT, TOTAL: Compl, Total (CH50): >60 U/mL (ref >41)

## 2021-12-04 NOTE — Addendum Note (Signed)
Addended by: Dollene Cleveland R on: 12/04/2021 02:19 PM ? ? Modules accepted: Orders ? ?

## 2021-12-10 ENCOUNTER — Ambulatory Visit: Payer: Managed Care, Other (non HMO)

## 2022-05-27 ENCOUNTER — Encounter: Payer: Self-pay | Admitting: Allergy & Immunology

## 2022-05-27 ENCOUNTER — Ambulatory Visit: Payer: Managed Care, Other (non HMO) | Admitting: Allergy & Immunology

## 2022-05-27 ENCOUNTER — Other Ambulatory Visit: Payer: Self-pay

## 2022-05-27 VITALS — BP 108/68 | HR 74 | Resp 16

## 2022-05-27 DIAGNOSIS — J310 Chronic rhinitis: Secondary | ICD-10-CM | POA: Diagnosis not present

## 2022-05-27 DIAGNOSIS — Z23 Encounter for immunization: Secondary | ICD-10-CM

## 2022-05-27 DIAGNOSIS — B999 Unspecified infectious disease: Secondary | ICD-10-CM | POA: Diagnosis not present

## 2022-05-27 DIAGNOSIS — R053 Chronic cough: Secondary | ICD-10-CM | POA: Diagnosis not present

## 2022-05-27 MED ORDER — FLUTICASONE PROPIONATE 50 MCG/ACT NA SUSP: 2.0000 | Freq: Two times a day (BID) | NASAL | 5 refills | Status: AC

## 2022-05-27 NOTE — Progress Notes (Signed)
FOLLOW UP  Date of Service/Encounter:  05/27/22   Assessment:   Chronic nonallergic rhinitis   Recurrent infections - received Pneumovax today   Chronic cough - improved with the use of nasal spray   Social upheaval - with divorce and raising her grand kids  Plan/Recommendations:   1. Chronic non-allergic rhinitis - Continue with the fluticasone one spray per nostril daily.  - This seems to be working very well.  - We could do more aggressive sensitive testing including intradermal (injections under the skin) in the future if we need to, but your symptoms do not seem severe enough to warrant this at this point in time.   2. Recurrent infections -  You were not protective against the majority of the Strep pneumoniae strains. - We did administer the Pneumovax today to boost your immunity. - This should both boost your immunity AND make sure that your immune system works appropriately. - We will re-check your levels in 4-6 weeks (the order is in for [facility], so you can go by a [facility] OR come back to our office to get that drawn).  - We will call you when these results come back.   3. Chronic cough - This seems to be better with the regular use of the fluticasone. - Continue to use this daily as you are doing.   4. Return in about 6 months (around 11/25/2022).   Subjective:   Megan Frederick is a 51 y.o. female presenting today for follow up of  Chief Complaint  Patient presents with   Allergic Rhinitis     Megan Frederick has a history of the following: Patient Active Problem List   Diagnosis Date Noted   Chronic rhinitis 11/21/2021   Recurrent infections 11/21/2021   Chronic cough 11/21/2021    History obtained from: chart review and patient.  Megan Frederick is a 51 y.o. female presenting for a follow up visit.  She was last seen as a new patient in March 2023.  At that time, she had testing done to the entire panel that was completely negative.  We  recommended continuing with what she was doing and offered intradermal testing in the future.  She was having multiple infections so we did an immune work-up.  Her immune work-up showed that she was nonprotective to streptococcal pneumonia titers.  We recommended that she get a Pneumovax and repeat titers in 4 to 6 weeks.  She was protective to tetanus and diphtheria.  Complement activity was normal.  Her immunoglobulin levels were normal as well.  Since the last visit, she has done well.   Asthma/Respiratory Symptom History: Her cough has improved with the use of the nasal spray.  Seems to be working well to control her symptoms.  As long as she uses the spray, her cough is minimal if nonexistent.  Allergic Rhinitis Symptom History: She never got her Pneumovax.  She is open to getting it today. She recently went to the [facility] sometime during the summer. She had enlarged lymph nodes. COVID testing was negative. Her cough did improve following the use of the nose drops. She is using the Flonase one spray per nostril up to twice daily.   Otherwise, there have been no changes to her past medical history, surgical history, family history, or social history.    Review of Systems  Constitutional:  Negative for chills, fever, malaise/fatigue and weight loss.  HENT:  Positive for congestion. Negative for ear discharge and ear pain.  Eyes:  Negative for pain, discharge and redness.  Respiratory:  Negative for cough, sputum production, shortness of breath and wheezing.   Cardiovascular: Negative.  Negative for chest pain and palpitations.  Gastrointestinal:  Negative for abdominal pain, blood in stool, constipation, diarrhea, heartburn, nausea and vomiting.  Skin: Negative.  Negative for itching and rash.  Neurological:  Negative for dizziness and headaches.  Endo/Heme/Allergies:  Positive for environmental allergies. Does not bruise/bleed easily.       Objective:   Blood pressure  108/68, pulse 74, resp. rate 16, SpO2 98 %. There is no height or weight on file to calculate BMI.    Physical Exam Vitals reviewed.  Constitutional:      Appearance: She is well-developed.     Comments: Pleasant and talkative.  HENT:     Head: Normocephalic and atraumatic.     Right Ear: Tympanic membrane, ear canal and external ear normal. No drainage, swelling or tenderness. Tympanic membrane is not injected, scarred, erythematous, retracted or bulging.     Left Ear: Tympanic membrane, ear canal and external ear normal. No drainage, swelling or tenderness. Tympanic membrane is not injected, scarred, erythematous, retracted or bulging.     Nose: No nasal deformity, septal deviation, mucosal edema or rhinorrhea.     Right Turbinates: Enlarged and swollen.     Left Turbinates: Enlarged and swollen.     Right Sinus: No maxillary sinus tenderness or frontal sinus tenderness.     Left Sinus: No maxillary sinus tenderness or frontal sinus tenderness.     Comments: No nasal polyps noted.    Mouth/Throat:     Mouth: Mucous membranes are not pale and not dry.     Pharynx: Uvula midline.  Eyes:     General: Lids are normal. Allergic shiner present.        Right eye: No discharge.        Left eye: No discharge.     Conjunctiva/sclera: Conjunctivae normal.     Right eye: Right conjunctiva is not injected. No chemosis.    Left eye: Left conjunctiva is not injected. No chemosis.    Pupils: Pupils are equal, round, and reactive to light.  Cardiovascular:     Rate and Rhythm: Normal rate and regular rhythm.     Heart sounds: Normal heart sounds.  Pulmonary:     Effort: Pulmonary effort is normal. No tachypnea, accessory muscle usage or respiratory distress.     Breath sounds: Normal breath sounds. No wheezing, rhonchi or rales.     Comments: Moving air well in all lung fields. Chest:     Chest wall: No tenderness.  Abdominal:     Tenderness: There is no abdominal tenderness. There is no  guarding or rebound.  Lymphadenopathy:     Head:     Right side of head: No submandibular, tonsillar or occipital adenopathy.     Left side of head: No submandibular, tonsillar or occipital adenopathy.     Cervical: No cervical adenopathy.  Skin:    General: Skin is warm.     Capillary Refill: Capillary refill takes less than 2 seconds.     Coloration: Skin is not pale.     Findings: No abrasion, erythema, petechiae or rash. Rash is not papular, urticarial or vesicular.     Comments: No eczematous or urticarial lesions noted.  She does have some dry skin in her right hand, otherwise normal.  Neurological:     Mental Status: She is alert.  Psychiatric:  Behavior: Behavior is cooperative.      Diagnostic studies: none       Salvatore Marvel, MD  [facility] [place]

## 2022-05-27 NOTE — Patient Instructions (Addendum)
1. Chronic non-allergic rhinitis - Continue with the fluticasone one spray per nostril daily.  - This seems to be working very well.  - We could do more aggressive sensitive testing including intradermal (injections under the skin) in the future if we need to, but your symptoms do not seem severe enough to warrant this at this point in time.   2. Recurrent infections -  You were not protective against the majority of the Strep pneumoniae strains. - We did administer the Pneumovax today to boost your immunity. - This should both boost your immunity AND make sure that your immune system works appropriately. - We will re-check your levels in 4-6 weeks (the order is in for [facility], so you can go by a [facility] OR come back to our office to get that drawn).  - We will call you when these results come back.   3. Chronic cough - This seems to be better with the regular use of the fluticasone. - Continue to use this daily as you are doing.   4. Return in about 6 months (around 11/25/2022).    Please inform us of any Emergency Department visits, hospitalizations, or changes in symptoms. Call us before going to the ED for breathing or allergy symptoms since we might be able to fit you in for a sick visit. Feel free to contact us anytime with any questions, problems, or concerns.  It was a pleasure to see you again today!  Websites that have reliable patient information: 1. American Academy of Asthma, Allergy, and Immunology: www.aaaai.org 2. Food Allergy Research and Education (FARE): foodallergy.org 3. Mothers of Asthmatics: http://www.asthmacommunitynetwork.org 4. American College of Allergy, Asthma, and Immunology: www.acaai.org   COVID-19 Vaccine Information can be found at: ShippingScam.co.uk For questions related to vaccine distribution or appointments, please email [email or web address] or call [phone].   We realize that you  might be concerned about having an allergic reaction to the COVID19 vaccines. To help with that concern, WE ARE OFFERING THE COVID19 VACCINES IN OUR OFFICE! Ask the front desk for dates!     "Like" [place] on Facebook and Instagram for our latest updates!      A healthy democracy works best when [employer] participate! Make sure you are registered to vote! If you have moved or changed any of your contact information, you will need to get this updated before voting!  In some cases, you MAY be able to register to vote online: [email or web address]

## 2022-05-29 NOTE — Progress Notes (Signed)
Immunotherapy   Patient Details  Name: Megan Frederick MRN: [id number] Date of Birth: 1970/12/21  05/29/2022  Megan Frederick received a pneumovax injection today 05/27/2022.  [employer] [id number] Lot # [id number] Exp 06/08/2023 Consent signed   Herbie Drape

## 2022-06-23 IMAGING — DX DG CHEST 2V
2 series · 2 of 2 positions shown · non-contrast
Comparison: March 20, 2021

CLINICAL DATA: Bilateral arm pain.

EXAM:
CHEST - 2 VIEW

[chest pa]
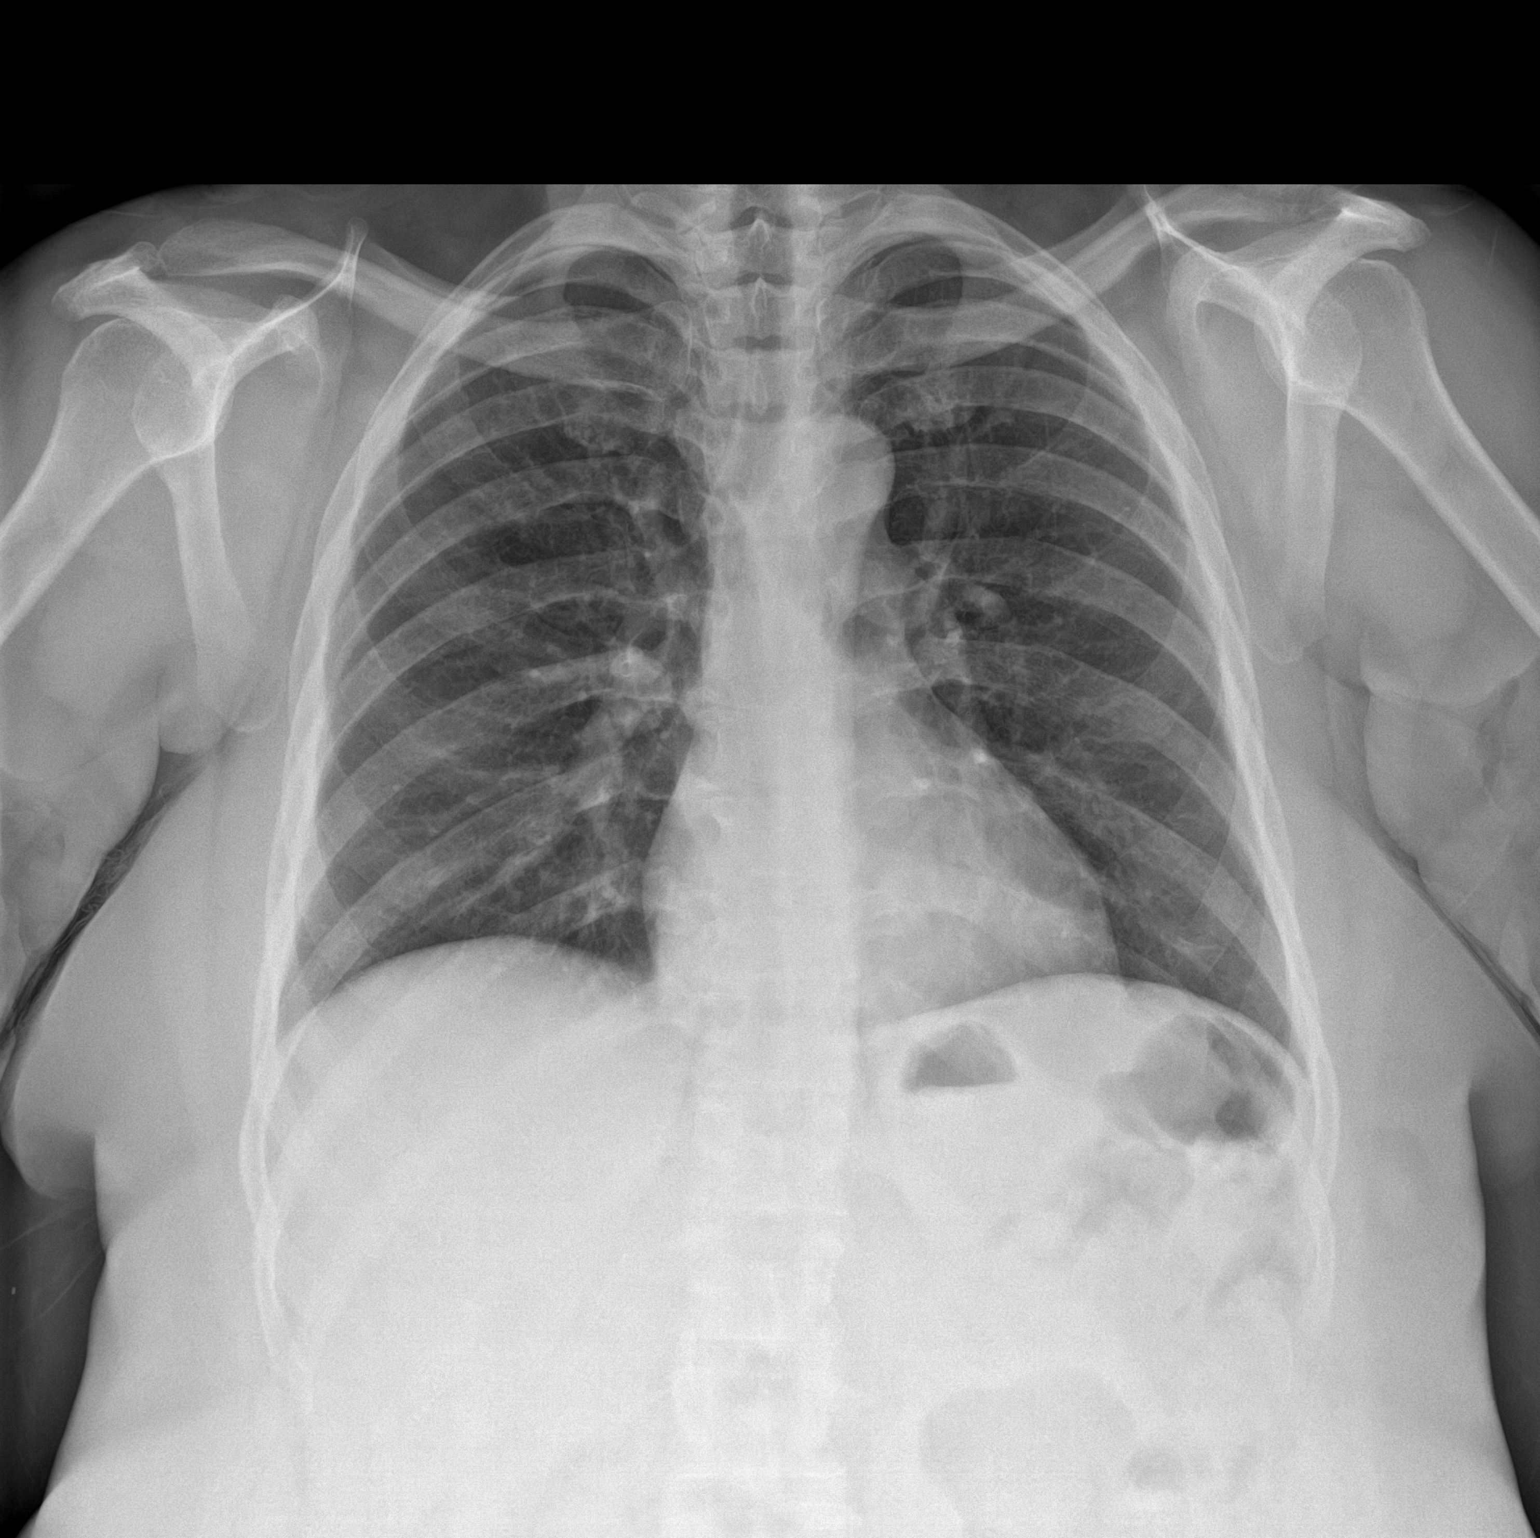

[chest lat]
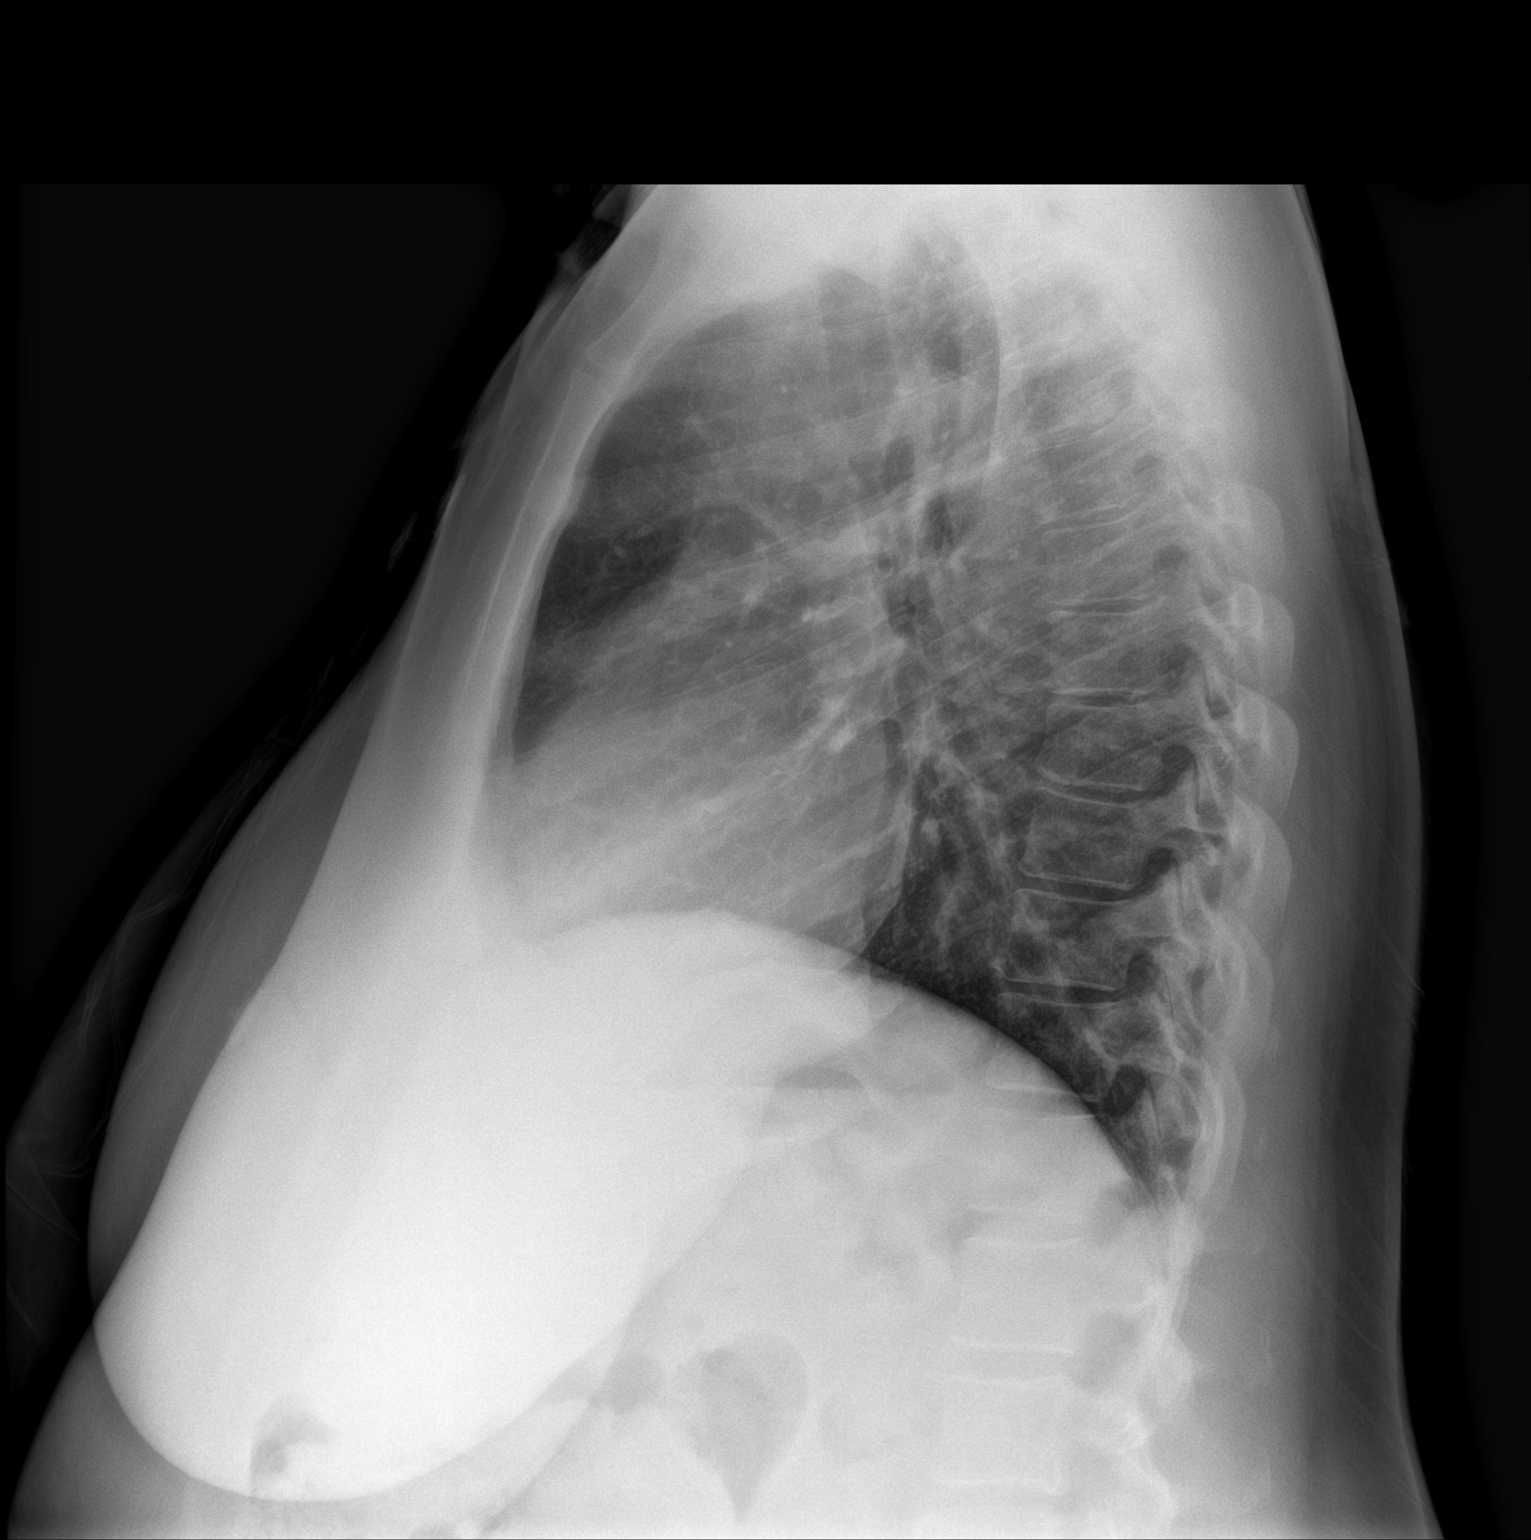

[2 of 2 positions shown; findings below may reference images not displayed]

FINDINGS: The heart size and mediastinal contours are within normal limits.
Both lungs are clear. The visualized skeletal structures are
unremarkable.
IMPRESSION: No active cardiopulmonary disease.

## 2022-06-23 IMAGING — CT CT HEAD W/O CM
4 series · 17 of 47 positions shown, 19 images · non-contrast
Comparison: None.

CLINICAL DATA: Dizziness.



[Series 2: head wo · axial · 0.41mm/px · z∈[-169,-59]mm · 7 of 30 slices shown, 9 images]
[im 4/30  brain]
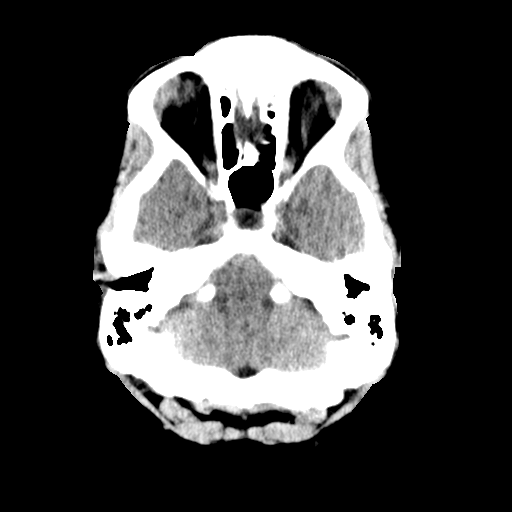
[im 4/30  bone]
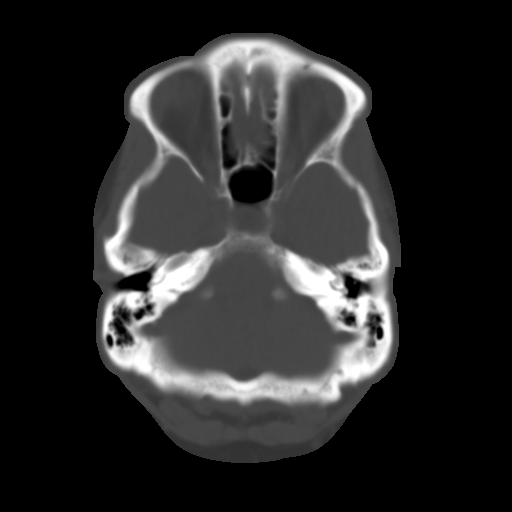
[im 8/30  brain]
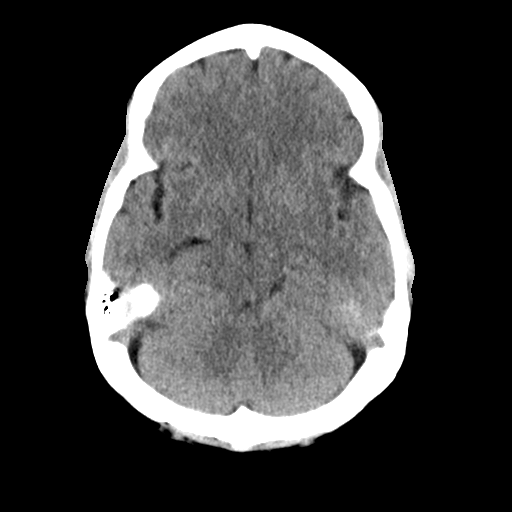
[im 11/30  brain]
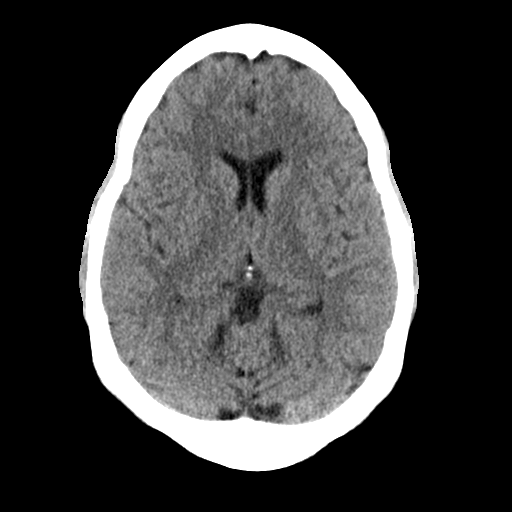
[im 15/30  brain]
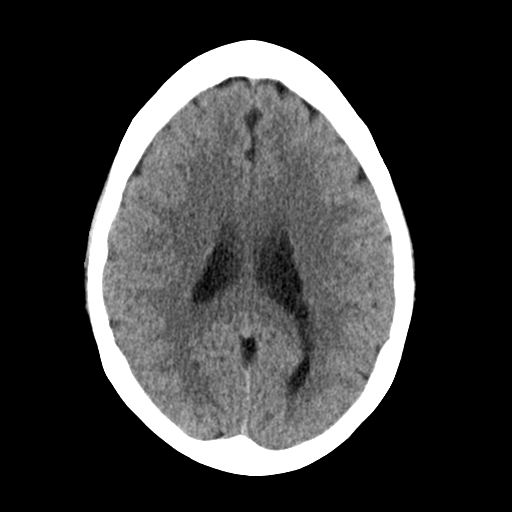
[im 19/30  brain]
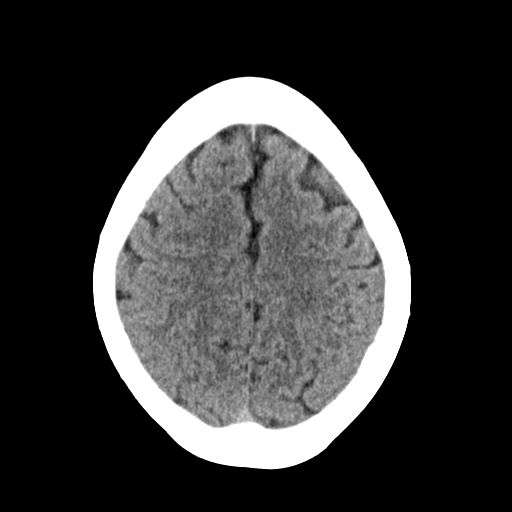
[im 19/30  bone]
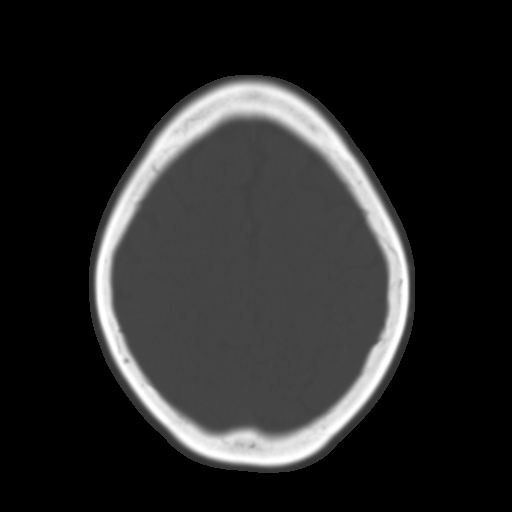
[im 22/30  brain]
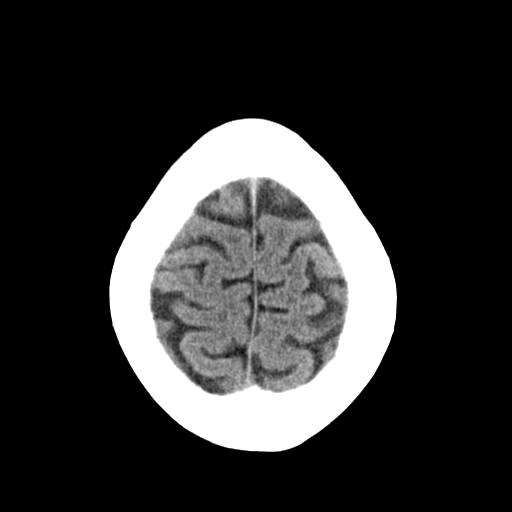
[im 26/30  brain]
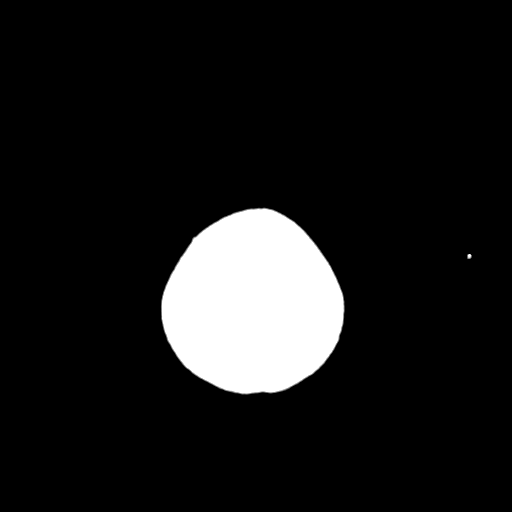

[Series 3: head bone · axial · 0.41mm/px · z∈[-170,-120]mm · 4 of 74 slices shown]
[im 8/74  bone]
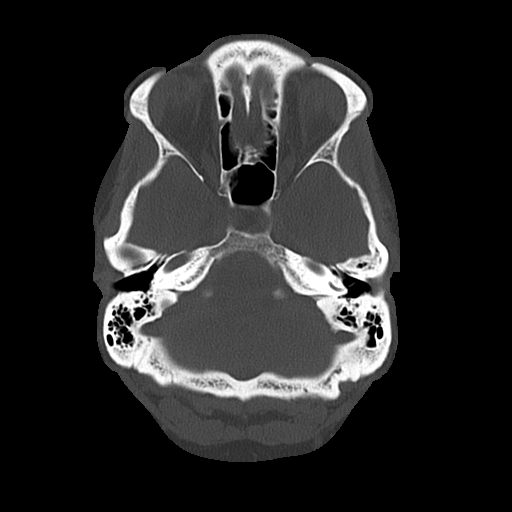
[im 15/74  bone]
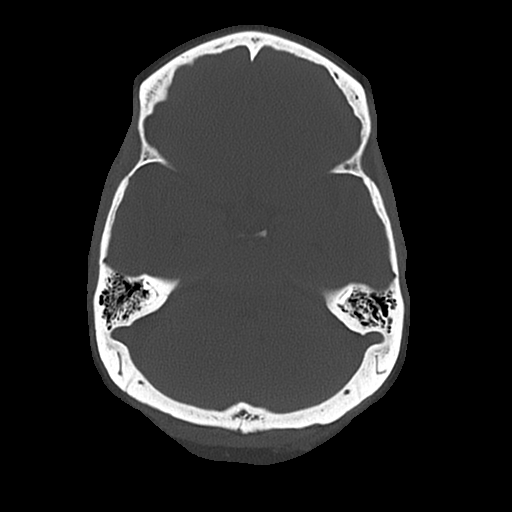
[im 22/74  bone]
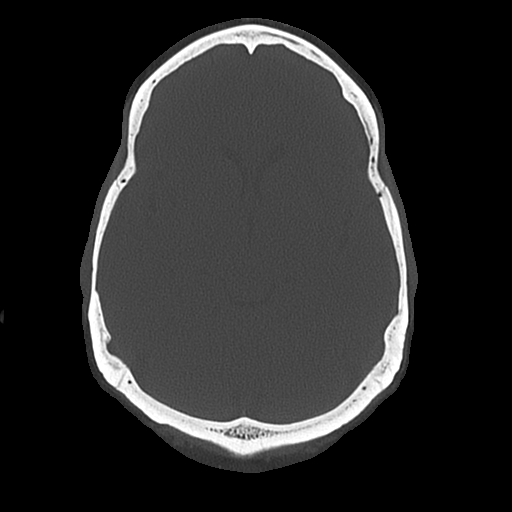
[im 33/74  bone]
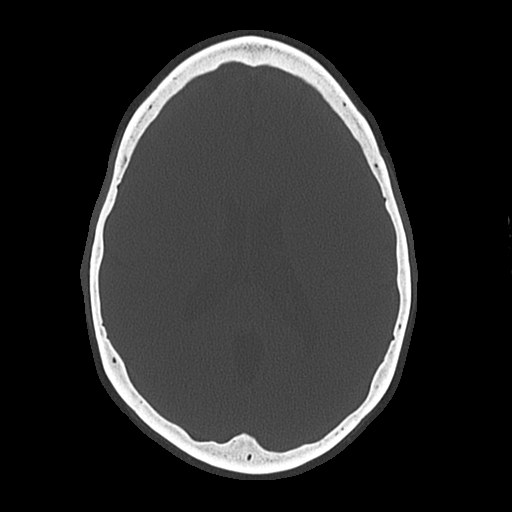

[Series 4: coronal soft · coronal · 0.29mm/px · 3 of 64 slices shown]
[im 22/64  brain]
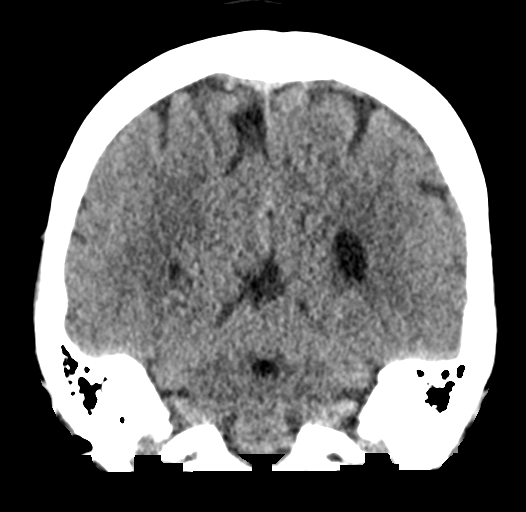
[im 29/64  brain]
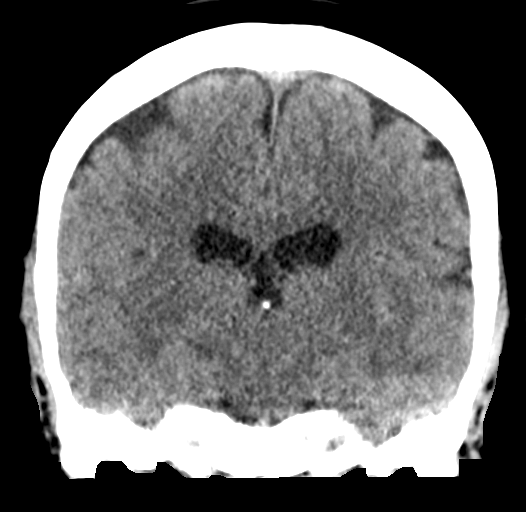
[im 36/64  brain]
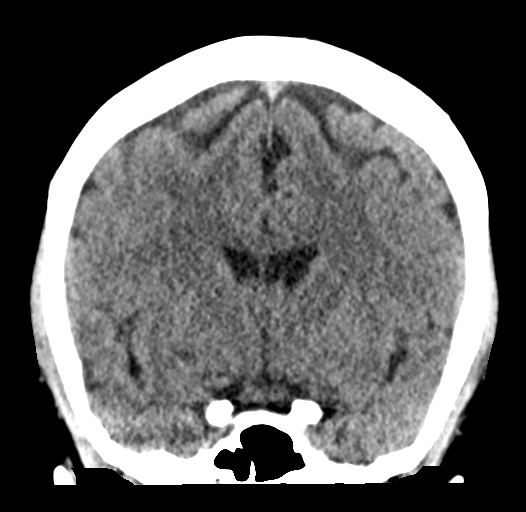

[Series 5: sagittal soft · sagittal · 0.29mm/px · 3 of 52 slices shown]
[im 18/52  brain]
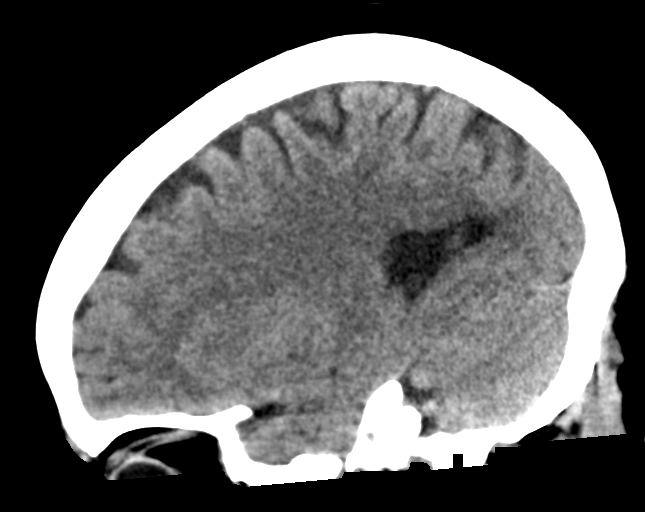
[im 26/52  brain]
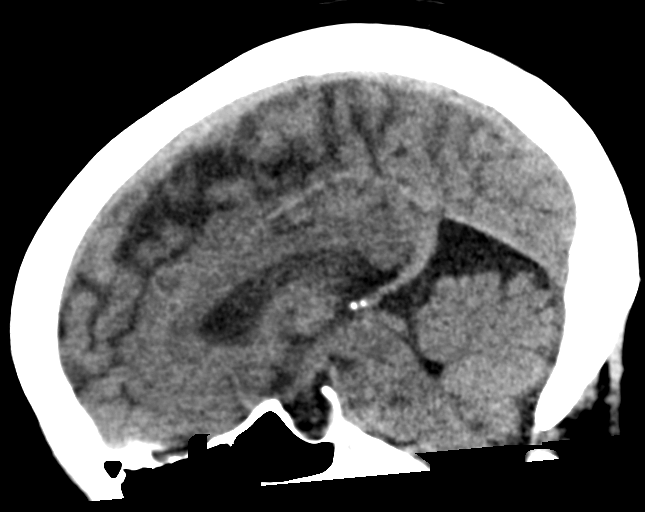
[im 35/52  brain]
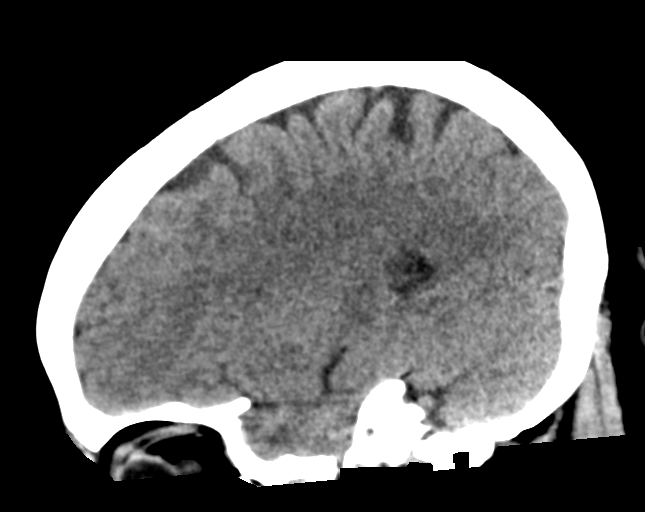

[17 of 47 positions shown; findings below may reference images not displayed]

FINDINGS: Brain: No evidence of acute infarction, hemorrhage, hydrocephalus,
extra-axial collection or mass lesion/mass effect.

Vascular: No hyperdense vessel or unexpected calcification.

Skull: Normal. Negative for fracture or focal lesion.

Sinuses/Orbits: No acute finding.

Other: None.
IMPRESSION: No acute intracranial pathology.

## 2022-07-31 ENCOUNTER — Other Ambulatory Visit: Payer: Self-pay

## 2022-07-31 ENCOUNTER — Other Ambulatory Visit: Payer: Self-pay | Admitting: Allergy & Immunology

## 2022-07-31 ENCOUNTER — Encounter: Payer: Self-pay | Admitting: Allergy & Immunology

## 2022-07-31 ENCOUNTER — Ambulatory Visit: Payer: Managed Care, Other (non HMO) | Admitting: Allergy & Immunology

## 2022-07-31 DIAGNOSIS — J0100 Acute maxillary sinusitis, unspecified: Secondary | ICD-10-CM | POA: Diagnosis not present

## 2022-07-31 DIAGNOSIS — J310 Chronic rhinitis: Secondary | ICD-10-CM | POA: Diagnosis not present

## 2022-07-31 DIAGNOSIS — B999 Unspecified infectious disease: Secondary | ICD-10-CM | POA: Diagnosis not present

## 2022-07-31 DIAGNOSIS — R053 Chronic cough: Secondary | ICD-10-CM | POA: Diagnosis not present

## 2022-07-31 MED ORDER — AZELASTINE HCL 0.15 % NA SOLN: 2.0000 | Freq: Two times a day (BID) | NASAL | 5 refills | Status: DC

## 2022-07-31 MED ORDER — AMOXICILLIN 875 MG PO TABS: 875.0000 mg | ORAL_TABLET | Freq: Two times a day (BID) | ORAL | 0 refills | Status: AC

## 2022-07-31 NOTE — Progress Notes (Signed)
RE: Megan Frederick MRN: [id number] DOB: 03-20-1971 Date of Telemedicine Visit: 07/31/2022  Referring provider: No ref. provider found Primary care provider: Pcp, No  Chief Complaint: No chief complaint on file.   Telemedicine Follow Up Visit via Telephone: I connected with Megan Frederick for a follow up on 07/31/22 by telephone and verified that I am speaking with the correct person using two identifiers.   I discussed the limitations, risks, security and privacy concerns of performing an evaluation and management service by telephone and the availability of in person appointments. I also discussed with the patient that there may be a patient responsible charge related to this service. The patient expressed understanding and agreed to proceed.  Patient is at work.  Provider is at the office.  Visit start time: 4:11 PM Visit end time: 4:35 PM Insurance consent/check in by: Joni Reining Medical consent and medical assistant/nurse: Morrie Sheldon  History of Present Illness:x  She is a 51 y.o. female, who is being followed for NAR, recurrent infections, and chronic cough. Her previous allergy office visit was in September 2023 with myself.  At that time, we continue with Flonase 1 spray per nostril daily.  We never did do intradermal testing.  For her recurrent infections, she was nonprotective to Streptococcus pneumonia.  We administer Pneumovax at that visit, but she never did get repeat testing.  For her cough, it had improved with the use of the Flonase.  In the interim, she has mostly done well. But she is here today for a sick visit. She reports a lot of light sensitivity. She started having symptoms yesterday and it worsened today. She did take some painkillers but this did not help at all. She did not use Sudafed today, but she is using her Flonase spray religiously. She has been doing that for a couple of days. She did take a Tylenol to help her sleep. She has been using her hot  washcloth. She has not had a fever at all to her knowledge  She has not COVID tested at all. She does not have any of the vaccines. We did not get into that at all today. There are no other sick contacts in the home. Her renal function in March 2023 was normal.   She has not felt this bad in ages. She does not remember the last time that she got a round of steroids or antibiotics.   She is establishing care with Corlis Hove on December 13th.   Otherwise, there have been no changes to her past medical history, surgical history, family history, or social history.  Patient sent me the test result via email:     Assessment and Plan:  Megan Frederick is a 51 y.o. female with:  Chronic nonallergic rhinitis   Recurrent infections - received Pneumovax today   Chronic cough - improved with the use of nasal spray   Social upheaval - with divorce and raising her grand kids  Acute sinusitis - negative COVID test   I did recommend that she take a home COVID test which was negative. I recommended increasing hydration and using a lot of OTC medications to help her get through this. I did send in a course of amoxicillin out of an abundance of caution to take if there was no improvement in her symptoms in a few days. She is going to continue with her nasal sprays.   Diagnostics: None.  Medication List:  Current Outpatient Medications  Medication Sig Dispense Refill   Acetaminophen (  TYLENOL PO) Take by mouth as needed. Extra strength     Ascorbic Acid (VITAMIN C) 100 MG tablet Take 100 mg by mouth daily.     fluticasone (FLONASE) 50 MCG/ACT nasal spray Place 2 sprays into both nostrils in the morning and at bedtime. 1 g 5   mometasone (ELOCON) 0.1 % cream Apply 1 application. topically daily. 50 g 3   Vitamin D, Ergocalciferol, (DRISDOL) 1.25 MG (50000 UNIT) CAPS capsule Take 50,000 Units by mouth See admin instructions. Once a week     No current facility-administered medications for this visit.    Allergies: No Known Allergies I reviewed her past medical history, social history, family history, and environmental history and no significant changes have been reported from previous visits.  Review of Systems  Constitutional:  Positive for chills. Negative for activity change, appetite change, diaphoresis and fatigue.  HENT:  Positive for congestion, sinus pressure and sore throat. Negative for postnasal drip and rhinorrhea.   Eyes:  Negative for pain, discharge, redness and itching.  Respiratory:  Positive for cough. Negative for shortness of breath, wheezing and stridor.   Gastrointestinal:  Negative for diarrhea, nausea and vomiting.  Musculoskeletal:  Negative for arthralgias, joint swelling and myalgias.  Skin:  Negative for rash.  Allergic/Immunologic: Positive for environmental allergies. Negative for food allergies.    Objective:  Physical exam not obtained as encounter was done via telephone.   Previous notes and tests were reviewed.  I discussed the assessment and treatment plan with the patient. The patient was provided an opportunity to ask questions and all were answered. The patient agreed with the plan and demonstrated an understanding of the instructions.   The patient was advised to call back or seek an in-person evaluation if the symptoms worsen or if the condition fails to improve as anticipated.  I provided 24 minutes of non-face-to-face time during this encounter.  It was my pleasure to participate in Megan Frederick's care today. Please feel free to contact me with any questions or concerns.   Sincerely,  Alfonse Spruce, MD

## 2022-08-05 ENCOUNTER — Telehealth: Payer: Self-pay

## 2022-08-05 ENCOUNTER — Encounter: Payer: Self-pay | Admitting: Allergy & Immunology

## 2022-08-05 NOTE — Telephone Encounter (Signed)
Alfonse Spruce, MD  [facility] Can someone call and see how she is feeling?  Tried calling pt but no voicemail box is set up currently

## 2022-08-05 NOTE — Telephone Encounter (Signed)
Called and spoke with the patient, she stated that she is doing much better and that the Sudafed she discontinued after the 2nd dose because it was too strong for her. She states that the Flonase spray and antibiotic and the new nasal spray made the back of her throat raw so she is just using the Flonase following nasal saline rinses and she is doing much better.

## 2022-08-13 ENCOUNTER — Encounter: Payer: Self-pay | Admitting: Student

## 2022-08-13 ENCOUNTER — Ambulatory Visit: Payer: Managed Care, Other (non HMO) | Admitting: Student

## 2022-08-13 VITALS — BP 118/82 | HR 75 | Ht 65.0 in | Wt 223.9 lb

## 2022-08-13 DIAGNOSIS — Z7689 Persons encountering health services in other specified circumstances: Secondary | ICD-10-CM | POA: Diagnosis not present

## 2022-08-13 DIAGNOSIS — Z01419 Encounter for gynecological examination (general) (routine) without abnormal findings: Secondary | ICD-10-CM | POA: Diagnosis not present

## 2022-08-13 NOTE — Progress Notes (Signed)
   ANNUAL EXAM Patient name: Megan Frederick MRN [id number]  Date of birth: [birth year] Chief Complaint:   Gynecologic Exam  History of Present Illness:   Megan Frederick is a 51 y.o. [id number] African-American female being seen today for a routine annual exam.  Current complaints: Postmenopausal symptom management. Reports difficulty falling asleep and occasional hot flashes.   No LMP recorded. Patient is postmenopausal.   Last pap 2022. Results were: negative per pt report at clinic in [place] . Last mammogram: 2021. Results were: abnormal , with reassuring biopsy . Family h/o breast cancer: yes aunts on both sides Last colonoscopy: n/a. Results were: N/A. Family h/o colorectal cancer: no     08/13/2022    8:50 AM  Depression screen PHQ 2/9  Decreased Interest 0  Down, Depressed, Hopeless 0  PHQ - 2 Score 0         No data to display           Review of Systems:   Pertinent items are noted in HPI Denies any headaches, blurred vision, fatigue, shortness of breath, chest pain, abdominal pain, abnormal vaginal discharge/itching/odor/irritation, problems with periods, bowel movements, urination, or intercourse unless otherwise stated above. Pertinent History Reviewed:  Reviewed past medical,surgical, social and family history.  Reviewed problem list, medications and allergies. Physical Assessment:   Vitals:   08/13/22 0836  BP: 118/82  Pulse: 75  Weight: 223 lb 14.4 oz (101.6 kg)  Height: 5\' 5"  (1.651 m)  Body mass index is 37.26 kg/m.        Physical Examination:   General appearance - well appearing, and in no distress  Mental status - alert, oriented to person, place, and time  Psych:  She has a normal mood and affect  Skin - warm and dry, normal color, no suspicious lesions noted  Chest - effort normal, all lung fields clear to auscultation bilaterally  Heart - normal rate and regular rhythm  Neck:  midline trachea, no thyromegaly or  nodules  Breasts - breasts appear normal, no suspicious masses, no skin or nipple changes or axillary nodes  Abdomen - soft, nontender, nondistended, no masses or organomegaly  Pelvic - Thin prep pap is not done   ADNEXA: No adnexal masses or tenderness noted.  Extremities:  No swelling or varicosities noted  Chaperone present for exam  No results found for this or any previous visit (from the past 24 hour(s)).  Assessment & Plan:  1. Women's annual routine gynecological examination - Normal exam - Provider will send patient printed information on recommended lifestyle practices - MM 3D SCREEN BREAST BILATERAL; Future - Hemoglobin A1c - Ambulatory referral to Gastroenterology  2. Encounter to establish care - Welcomed to practice  Labs/procedures today: HgbA1c  Mammogram: schedule screening mammo as soon as possible, or sooner if problems Colonoscopy: schedule screening colonoscopy as soon as possible, or sooner if problems  Orders Placed This Encounter  Procedures   MM 3D SCREEN BREAST BILATERAL   Hemoglobin A1c   Ambulatory referral to Gastroenterology    Meds: No orders of the defined types were placed in this encounter.   Follow-up: Return in about 1 year (around 08/14/2023), or if symptoms worsen or fail to improve, for ANN.  08/16/2023, NP 08/13/2022 9:41 AM

## 2022-08-13 NOTE — Progress Notes (Signed)
New patient presents for annual exam and to establish care. Last mammogram was in 2021. Last PAP was in 2022 and was normal per patient. Denies any abnormal vaginal discharge, odor, pain. Denies any breast changes. No concerns at this time.

## 2022-08-14 LAB — HEMOGLOBIN A1C
Est. average glucose Bld gHb Est-mCnc: 120 mg/dL
Hgb A1c MFr Bld: 5.8 % — ABNORMAL HIGH (ref 4.8–5.6)

## 2022-08-14 NOTE — Patient Instructions (Signed)
Natural Remedies for Postmenopausal Symptoms  Acupuncture--  Considered a part of traditional [place] medicine and has been practiced for thousands of years. Traditionally it is thought to stimulate the flow of a form of energy called qi (chee) throughout the body via a meridian system.Traditional [place] acupuncture, which is commonly used in [place], utilizes needle manipulation to produce sensations aimed at relieving hot flashes, fatigue, stress, anxiety, and sleep disorders  Black cohosh - Among the alternative therapies available for management of hot flashes, black cohosh (Actaea racemosa or Cimicifuga racemosa) is one of the most widely used A potential safety concern about black cohosh is its possible estrogenic effect on the breast or liver function. Caution for women with history of breast cancer or liver injury  Weight loss - Obesity is a risk factor for hot flashes . Weight loss may help reduce hot flashes  Evening primrose oil - Evening primrose oil is a rich source of gamma-linolenic acid and is widely used by postmenopausal women to manage hot flashes  Plant-derived estrogens (phytoestrogens) -- Plant-derived estrogens have been marketed as a "natural" or "safer" alternative to hormones for relieving menopausal symptoms. Phytoestrogens are found in many foods, including soybeans, chickpeas, lentils, flaxseed, lentils, grains, fruits, vegetables, and red clover. Many experts suggest that people who have a history of breast cancer avoid phytoestrogens.  Mind-body and other treatments -- Stress management, relaxation, deep breathing, acupuncture, acupressure, and reflexologyand yoga might be helpful for some people   **Be advised that there is not currently a lot of strong evidence supporting non-hormonal therapies for treatment of postmenopausal symptoms. These are not recommendations, but options for patients to trial at their discretion.**  Courtesy of [employer] 2023.

## 2022-08-15 ENCOUNTER — Telehealth: Payer: Self-pay

## 2022-08-15 NOTE — Telephone Encounter (Signed)
S/w pt and advised of A1c results, pt would like to have virtual appt with provider to discuss. Advised scheduler will call

## 2022-08-19 ENCOUNTER — Encounter (HOSPITAL_BASED_OUTPATIENT_CLINIC_OR_DEPARTMENT_OTHER): Payer: Self-pay | Admitting: Radiology

## 2022-08-19 ENCOUNTER — Ambulatory Visit (HOSPITAL_BASED_OUTPATIENT_CLINIC_OR_DEPARTMENT_OTHER)
Admission: RE | Admit: 2022-08-19 | Discharge: 2022-08-19 | Disposition: A | Discharge status: Home / Self Care | Payer: Managed Care, Other (non HMO) | Source: Ambulatory Visit | Attending: Student | Admitting: Student

## 2022-08-19 DIAGNOSIS — Z1231 Encounter for screening mammogram for malignant neoplasm of breast: Secondary | ICD-10-CM | POA: Diagnosis not present

## 2022-08-19 DIAGNOSIS — Z01419 Encounter for gynecological examination (general) (routine) without abnormal findings: Secondary | ICD-10-CM | POA: Diagnosis present

## 2022-08-20 ENCOUNTER — Encounter: Payer: Self-pay | Admitting: Student

## 2022-08-20 ENCOUNTER — Encounter: Payer: Managed Care, Other (non HMO) | Admitting: Student

## 2022-08-20 NOTE — Progress Notes (Signed)
Patient was unavailable when provider attempted to connect for virtual visit. Will need to re-schedule.

## 2022-08-20 NOTE — Progress Notes (Signed)
Pt presents for virtual f/u. No new concerns at this time.

## 2022-08-28 ENCOUNTER — Encounter: Payer: Self-pay | Admitting: Nurse Practitioner

## 2022-08-28 ENCOUNTER — Ambulatory Visit: Payer: Managed Care, Other (non HMO) | Admitting: Nurse Practitioner

## 2022-08-28 VITALS — BP 118/78 | HR 62 | Temp 96.8°F | Ht 65.0 in | Wt 225.0 lb

## 2022-08-28 DIAGNOSIS — E559 Vitamin D deficiency, unspecified: Secondary | ICD-10-CM | POA: Diagnosis not present

## 2022-08-28 DIAGNOSIS — E669 Obesity, unspecified: Secondary | ICD-10-CM | POA: Diagnosis not present

## 2022-08-28 DIAGNOSIS — Z13220 Encounter for screening for lipoid disorders: Secondary | ICD-10-CM

## 2022-08-28 DIAGNOSIS — R7303 Prediabetes: Secondary | ICD-10-CM

## 2022-08-28 DIAGNOSIS — Z1211 Encounter for screening for malignant neoplasm of colon: Secondary | ICD-10-CM

## 2022-08-28 DIAGNOSIS — R002 Palpitations: Secondary | ICD-10-CM | POA: Diagnosis not present

## 2022-08-28 DIAGNOSIS — R41840 Attention and concentration deficit: Secondary | ICD-10-CM | POA: Insufficient documentation

## 2022-08-28 LAB — COMPREHENSIVE METABOLIC PANEL
ALT: 12 U/L (ref 0–35)
AST: 15 U/L (ref 0–37)
Albumin: 3.8 g/dL (ref 3.5–5.2)
Alkaline Phosphatase: 75 U/L (ref 39–117)
BUN: 21 mg/dL (ref 6–23)
CO2: 26 mEq/L (ref 19–32)
Calcium: 9.1 mg/dL (ref 8.4–10.5)
Chloride: 107 mEq/L (ref 96–112)
Creatinine, Ser: 0.75 mg/dL (ref 0.40–1.20)
GFR: 90.61 mL/min (ref >60.00)
Glucose, Bld: 100 mg/dL — ABNORMAL HIGH (ref 70–99)
Potassium: 3.7 mEq/L (ref 3.5–5.1)
Sodium: 140 mEq/L (ref 135–145)
Total Bilirubin: 0.4 mg/dL (ref 0.2–1.2)
Total Protein: 7.4 g/dL (ref 6.0–8.3)

## 2022-08-28 LAB — LIPID PANEL
Cholesterol: 205 mg/dL — ABNORMAL HIGH (ref 0–200)
HDL: 42.6 mg/dL (ref >39.00)
LDL Cholesterol: 149 mg/dL — ABNORMAL HIGH (ref 0–99)
NonHDL: 162.77
Total CHOL/HDL Ratio: 5
Triglycerides: 68 mg/dL (ref 0.0–149.0)
VLDL: 13.6 mg/dL (ref 0.0–40.0)

## 2022-08-28 LAB — CBC
HCT: 40.2 % (ref 36.0–46.0)
Hemoglobin: 13.3 g/dL (ref 12.0–15.0)
MCHC: 33.1 g/dL (ref 30.0–36.0)
MCV: 88.1 fl (ref 78.0–100.0)
Platelets: 229 10*3/uL (ref 150.0–400.0)
RBC: 4.57 Mil/uL (ref 3.87–5.11)
RDW: 14 % (ref 11.5–15.5)
WBC: 5.3 10*3/uL (ref 4.0–10.5)

## 2022-08-28 LAB — HM DIABETES EYE EXAM

## 2022-08-28 NOTE — Patient Instructions (Addendum)
It was great to see you!  I have placed a referral to a dietician.   I am placing a referral for ADD testing  We are checking your labs today and will let you know the results via mychart/phone.   You can keep taking the melatonin at bedtime.   The cologuard will be mailed to your house with instructions.   Let's follow-up in 6 months, sooner if you have concerns.  If a referral was placed today, you will be contacted for an appointment. Please note that routine referrals can sometimes take up to 3-4 weeks to process. Please call our office if you haven't heard anything after this time frame.  Take care,  Rodman Pickle, NP

## 2022-08-28 NOTE — Assessment & Plan Note (Signed)
She has been experiencing some trouble focusing and concentrating over the past few years.  It makes it somewhat difficult for work at home.  2 of her children have ADD and she would like to be tested for this.  Referral placed to psychology for ADD testing.

## 2022-08-28 NOTE — Progress Notes (Signed)
New Patient Visit  BP 118/78 (BP Location: Right Arm, Patient Position: Sitting, Cuff Size: Normal)   Pulse 62   Temp (!) 96.8 F (36 C) (Temporal)   Ht 5\' 5"  (1.651 m)   Wt 225 lb (102.1 kg)   SpO2 95%   BMI 37.44 kg/m    Subjective:    Patient ID: , female    DOB: 04-29-1971, 51 y.o.   MRN: [id number]  CC: Chief Complaint  Patient presents with   Establish Care    New pt, est care  Prediabetes discussion , needs referral for nutritionist  Menopause  Requesting records for pap    HPI: Megan Frederick is a 51 y.o. female presents for new patient visit to establish care.  Introduced to 40 role and practice setting.  All questions answered.  Discussed provider/patient relationship and expectations.  She has a history of allergies and is following with an allergist. She was given a pneumonia vaccine recently.   She has been having trouble sleeping since going through menopause a few years ago. She states that she can fall asleep, but has been waking up every 2 hours. She has been taking melatonin and focusing on sleep hygiene which has helped. The past few nights she has been sleeping 4 hours at a time. She has also noticed a few episodes of being forgetful or having trouble remembering a person's name.   3 times in the last year she experienced palpitations. She saw cardiology in the past and had a stress test that was normal. She has been having increase stress in the last few years as well. She has done therapy in the past.   She has recently been diagnosed with prediabetes by her GYN. She is interested in talking with a dietician.      08/28/2022    9:21 AM 08/13/2022    8:50 AM  Depression screen PHQ 2/9  Decreased Interest 0 0  Down, Depressed, Hopeless 0 0  PHQ - 2 Score 0 0  Altered sleeping 0   Tired, decreased energy 0   Change in appetite 0   Feeling bad or failure about yourself  0   Trouble concentrating 0    Moving slowly or fidgety/restless 0   Suicidal thoughts 0   PHQ-9 Score 0   Difficult doing work/chores Not difficult at all       08/28/2022    9:21 AM  GAD 7 : Generalized Anxiety Score  Nervous, Anxious, on Edge 0  Control/stop worrying 0  Worry too much - different things 0  Trouble relaxing 0  Restless 0  Easily annoyed or irritable 0  Afraid - awful might happen 0  Total GAD 7 Score 0  Anxiety Difficulty Not difficult at all    Past Medical History:  Diagnosis Date   Allergy    Eczema    Migraine    Prediabetes     Past Surgical History:  Procedure Laterality Date   BREAST BIOPSY     SPINE SURGERY     cyst on tailbone   TUBAL LIGATION      Family History  Problem Relation Age of Onset   Cancer Mother        lung   Hepatitis Father    Drug abuse Father    Breast cancer Sister    Asthma Daughter    Allergic rhinitis Daughter    Breast cancer Half-Sister 7   Breast cancer Half-Sister 66 - 39  Eczema Grandson    Asthma Grandson    Allergic rhinitis Grandson    Allergic rhinitis Granddaughter    Asthma Granddaughter      Social History   Tobacco Use   Smoking status: Never    Passive exposure: Never   Smokeless tobacco: Never  Vaping Use   Vaping Use: Never used  Substance Use Topics   Alcohol use: Not Currently   Drug use: Never    Current Outpatient Medications on File Prior to Visit  Medication Sig Dispense Refill   Acetaminophen (TYLENOL PO) Take by mouth as needed. Extra strength     fluticasone (FLONASE) 50 MCG/ACT nasal spray Place 2 sprays into both nostrils in the morning and at bedtime. 1 g 5   No current facility-administered medications on file prior to visit.     Review of Systems  Constitutional:  Positive for fatigue.  HENT: Negative.    Eyes: Negative.   Respiratory: Negative.    Cardiovascular: Negative.   Gastrointestinal: Negative.   Genitourinary: Negative.   Musculoskeletal: Negative.   Skin: Negative.    Neurological: Negative.   Psychiatric/Behavioral:  Positive for sleep disturbance. Negative for dysphoric mood. The patient is not nervous/anxious.       Objective:    BP 118/78 (BP Location: Right Arm, Patient Position: Sitting, Cuff Size: Normal)   Pulse 62   Temp (!) 96.8 F (36 C) (Temporal)   Ht 5\' 5"  (1.651 m)   Wt 225 lb (102.1 kg)   SpO2 95%   BMI 37.44 kg/m   Wt Readings from Last 3 Encounters:  08/28/22 225 lb (102.1 kg)  08/13/22 223 lb 14.4 oz (101.6 kg)  11/21/21 224 lb 6.4 oz (101.8 kg)    BP Readings from Last 3 Encounters:  08/28/22 118/78  08/13/22 118/82  05/27/22 108/68    Physical Exam Vitals and nursing note reviewed.  Constitutional:      General: She is not in acute distress.    Appearance: Normal appearance.  HENT:     Head: Normocephalic.     Right Ear: Tympanic membrane, ear canal and external ear normal.     Left Ear: Tympanic membrane, ear canal and external ear normal.  Eyes:     Conjunctiva/sclera: Conjunctivae normal.  Cardiovascular:     Rate and Rhythm: Normal rate and regular rhythm.     Pulses: Normal pulses.     Heart sounds: Normal heart sounds.  Pulmonary:     Effort: Pulmonary effort is normal.     Breath sounds: Normal breath sounds.  Abdominal:     Palpations: Abdomen is soft.     Tenderness: There is no abdominal tenderness.  Musculoskeletal:     Cervical back: Normal range of motion and neck supple. No tenderness.     Right lower leg: No edema.     Left lower leg: No edema.  Lymphadenopathy:     Cervical: No cervical adenopathy.  Skin:    General: Skin is warm.  Neurological:     General: No focal deficit present.     Mental Status: She is alert and oriented to person, place, and time.  Psychiatric:        Mood and Affect: Mood normal.        Behavior: Behavior normal.        Thought Content: Thought content normal.        Judgment: Judgment normal.        Assessment & Plan:   Problem List Items  Addressed  This Visit       Other   Prediabetes - Primary    Recent A1c was 5.8%.  Gave information on nutrition and exercise.  Will also place referral to a dietitian.  Follow-up in 6 months.      Relevant Orders   Amb ref to Medical Nutrition Therapy-MNT   CBC   Comprehensive metabolic panel   Vitamin B12   Impaired concentration    She has been experiencing some trouble focusing and concentrating over the past few years.  It makes it somewhat difficult for work at home.  2 of her children have ADD and she would like to be tested for this.  Referral placed to psychology for ADD testing.      Relevant Orders   Ambulatory referral to Psychology   Obesity (BMI 30-39.9)    BMI 37.4.  Discussed nutrition and exercise.      Relevant Orders   Amb ref to Medical Nutrition Therapy-MNT   Palpitation    She has had 3 episodes of palpitations in the last year.  She has been having some increase stress which could be causing these palpitations.  She has seen cardiology in the past and had a normal stress test.  Will check CMP, CBC, TSH today.      Relevant Orders   Vitamin B12   TSH   Vitamin D deficiency    She has taken a vitamin D supplement in the past.  Will recheck vitamin D levels today and treat based on results.      Relevant Orders   VITAMIN D 25 Hydroxy (Vit-D Deficiency, Fractures)   Other Visit Diagnoses     Screening, lipid       Screen lipid panel today.   Relevant Orders   Lipid panel   Screen for colon cancer       Cologuard ordered today   Relevant Orders   Cologuard        Follow up plan: Return in about 6 months (around 02/27/2023) for CPE.

## 2022-08-28 NOTE — Assessment & Plan Note (Signed)
Recent A1c was 5.8%.  Gave information on nutrition and exercise.  Will also place referral to a dietitian.  Follow-up in 6 months.

## 2022-08-28 NOTE — Assessment & Plan Note (Signed)
She has taken a vitamin D supplement in the past.  Will recheck vitamin D levels today and treat based on results.

## 2022-08-28 NOTE — Assessment & Plan Note (Signed)
She has had 3 episodes of palpitations in the last year.  She has been having some increase stress which could be causing these palpitations.  She has seen cardiology in the past and had a normal stress test.  Will check CMP, CBC, TSH today.

## 2022-08-28 NOTE — Assessment & Plan Note (Signed)
BMI 37.4.  Discussed nutrition and exercise.

## 2022-08-29 LAB — VITAMIN D 25 HYDROXY (VIT D DEFICIENCY, FRACTURES): VITD: 19.93 ng/mL — ABNORMAL LOW (ref 30.00–100.00)

## 2022-08-29 LAB — VITAMIN B12: Vitamin B-12: 310 pg/mL (ref 211–911)

## 2022-08-29 LAB — TSH: TSH: 1.96 u[IU]/mL (ref 0.35–5.50)

## 2022-08-29 MED ORDER — VITAMIN D (ERGOCALCIFEROL) 1.25 MG (50000 UNIT) PO CAPS: 50000.0000 [IU] | ORAL_CAPSULE | ORAL | 0 refills | Status: DC

## 2022-08-29 NOTE — Addendum Note (Signed)
Addended by: Rodman Pickle A on: 08/29/2022 08:07 AM   Modules accepted: Orders

## 2022-09-24 LAB — COLOGUARD: COLOGUARD: NEGATIVE

## 2022-10-07 ENCOUNTER — Encounter: Payer: Self-pay | Admitting: Dietician

## 2022-10-07 ENCOUNTER — Encounter: Payer: Self-pay | Attending: Nurse Practitioner | Admitting: Dietician

## 2022-10-07 VITALS — Ht 65.0 in | Wt 226.0 lb

## 2022-10-07 DIAGNOSIS — R7303 Prediabetes: Secondary | ICD-10-CM | POA: Insufficient documentation

## 2022-10-07 NOTE — Patient Instructions (Addendum)
Aim for 150 minutes of physical activity weekly. Goal: dance for 15 minutes 3x/wk (M, W, F)   Goal: cut down to 2 tsp of sweetener to tea or coffee  Goal: aim to drink 1 water bottle by lunch and 1 by the end of the day.  Eat more Non-Starchy Vegetables and Fruits. Aim to include one in every meal.   Aim to have consistent meal times (3 meals per day)

## 2022-10-07 NOTE — Progress Notes (Signed)
Medical Nutrition Therapy  Appointment Start time:  [phone]  Appointment End time:  [phone]  Primary concerns today: Pt is concerned about her prediabetes diagnosis.    Referral diagnosis: prediabetes Preferred learning style: no preference indicated Learning readiness: ready   NUTRITION ASSESSMENT   Anthropometrics  Ht: 65in   Wt: 226lbs  Clinical Medical Hx: allergies, prediabetes Medications: reviewed Labs: 08/13/22 A1c 5.8% Notable Signs/Symptoms: none reported Food Allergies: none reported  Lifestyle & Dietary Hx  Pt is present today with her daughter.    Pt states she has been siting a lot while working from home and can tell the difference in her health.   Pt states she has not been walking as much or eating well.  Pt states she has been experiencing insomnia since menopause and will sometimes not fall asleep until 2am and then sleep for a few hours, take her grandkids to school and then go back to sleep for a few hours.  Pt recently stopped working due to having a high stress job.   Pt does not eat until 12pm for spiritual reasons but this fasting stopped at the beginning of February and she has kept up the habit.   Pt states sometimes money is tight so she does not have a lot of fresh fruits and vegetables but is aware of the resources available.   Estimated daily fluid intake: 16 oz Supplements: vitamin D Sleep: 5 hours Stress / self-care: moderate stress Current average weekly physical activity: ADLs  24-Hr Dietary Recall First Meal: none  Snack: none Second Meal: 12pm scrambled eggs and banana Third Meal: fried chicken with corn, green beans, yellow rice OR pork chops with yellow rice and broccoli Snack: fruit snacks OR chips Beverages: coffee with 5 sugar, tea with honey, water   NUTRITION DIAGNOSIS  [place]-2.2 Altered nutrition-related laboratory As related to prediabetes.  As evidenced by A1c 5.8%.   NUTRITION INTERVENTION  Nutrition education (E-1) on  the following topics:  Prediabetes Insulin resistance Building balanced meals and snacks Healthy eating on a budget MyPlate Impact of physical activity on blood glucose and health Importance of stress management Consistent meal times  Handouts Provided Include  Dish Up a Healthy Meal Meal Ideas  Learning Style & Readiness for Change Teaching method utilized: Visual & Auditory  Demonstrated degree of understanding via: Teach Back  Barriers to learning/adherence to lifestyle change: none  Goals Established by Pt Aim for 150 minutes of physical activity weekly. Goal: dance for 15 minutes 3x/wk (M, W, F)   Goal: cut down to 2 tsp of sweetener to tea or coffee  Goal: aim to drink 1 water bottle by lunch and 1 by the end of the day.  Eat more Non-Starchy Vegetables and Fruits. Aim to include one in every meal.   Aim to have consistent meal times (3 meals per day)   MONITORING & EVALUATION Dietary intake, weekly physical activity, and follow up in 3 months.  Next Steps  Patient is to call for questions.

## 2022-11-25 ENCOUNTER — Ambulatory Visit: Payer: Managed Care, Other (non HMO) | Admitting: Allergy & Immunology

## 2022-12-09 ENCOUNTER — Ambulatory Visit: Payer: Self-pay | Admitting: Allergy & Immunology

## 2022-12-09 ENCOUNTER — Other Ambulatory Visit: Payer: Self-pay | Admitting: Nurse Practitioner

## 2022-12-09 DIAGNOSIS — J309 Allergic rhinitis, unspecified: Secondary | ICD-10-CM

## 2022-12-10 NOTE — Telephone Encounter (Signed)
Patient notified of below message and will pick up some OTC Vitamin D today.

## 2022-12-17 ENCOUNTER — Encounter (HOSPITAL_BASED_OUTPATIENT_CLINIC_OR_DEPARTMENT_OTHER): Payer: Self-pay

## 2022-12-17 ENCOUNTER — Emergency Department (HOSPITAL_BASED_OUTPATIENT_CLINIC_OR_DEPARTMENT_OTHER)
Admission: EM | Admit: 2022-12-17 | Discharge: 2022-12-17 | Disposition: A | Discharge status: Home / Self Care | Payer: Medicaid Other | Attending: Emergency Medicine | Admitting: Emergency Medicine

## 2022-12-17 ENCOUNTER — Other Ambulatory Visit: Payer: Self-pay

## 2022-12-17 DIAGNOSIS — R590 Localized enlarged lymph nodes: Secondary | ICD-10-CM | POA: Insufficient documentation

## 2022-12-17 DIAGNOSIS — J029 Acute pharyngitis, unspecified: Secondary | ICD-10-CM | POA: Diagnosis not present

## 2022-12-17 DIAGNOSIS — Z1152 Encounter for screening for COVID-19: Secondary | ICD-10-CM | POA: Insufficient documentation

## 2022-12-17 LAB — SARS CORONAVIRUS 2 BY RT PCR: SARS Coronavirus 2 by RT PCR: NEGATIVE

## 2022-12-17 LAB — GROUP A STREP BY PCR: Group A Strep by PCR: NOT DETECTED

## 2022-12-17 MED ORDER — KETOROLAC TROMETHAMINE 30 MG/ML IJ SOLN
30.0000 mg | Freq: Once | INTRAMUSCULAR | Status: AC
  Administered 2022-12-17: 30 mg via INTRAMUSCULAR
  Filled 2022-12-17: qty 1

## 2022-12-17 MED ORDER — LIDOCAINE VISCOUS HCL 2 % MT SOLN
15.0000 mL | Freq: Once | OROMUCOSAL | Status: AC
  Administered 2022-12-17: 15 mL via OROMUCOSAL
  Filled 2022-12-17: qty 15

## 2022-12-17 MED ORDER — DEXAMETHASONE SODIUM PHOSPHATE 10 MG/ML IJ SOLN
10.0000 mg | Freq: Once | INTRAMUSCULAR | Status: AC
  Administered 2022-12-17: 10 mg via INTRAMUSCULAR
  Filled 2022-12-17: qty 1

## 2022-12-17 NOTE — Discharge Instructions (Signed)
Take ibuprofen as needed for pain.

## 2022-12-17 NOTE — ED Notes (Signed)
Patient resting quietly in stretcher, respirations even, unlabored, no acute distress noted. Denies needs at this time.  Awaiting provider orders.

## 2022-12-17 NOTE — ED Notes (Signed)
Reviewed AVS with patient, patient expressed understanding of directions, denies further questions at this time. 

## 2022-12-17 NOTE — ED Provider Notes (Signed)
[place] EMERGENCY DEPARTMENT AT [facility] Provider Note   CSN: [id number] Arrival date & time: 12/17/22  1839     History  Chief Complaint  Patient presents with   Sore Throat    Megan Frederick is a 52 y.o. female.  Pt is a 52 yo female with pmhx significant for migraines.  She woke up this am with a sore throat.  She took tylenol which helped a little.  No fever. She's had some sinus drainage.         Home Medications Prior to Admission medications   Medication Sig Start Date End Date Taking? Authorizing Provider  Acetaminophen (TYLENOL PO) Take by mouth as needed. Extra strength    [provider]  fluticasone (FLONASE) 50 MCG/ACT nasal spray Place 2 sprays into both nostrils in the morning and at bedtime. 05/27/22   Alfonse Spruce, MD  Vitamin D, Ergocalciferol, (DRISDOL) 1.25 MG (50000 UNIT) CAPS capsule Take 1 capsule (50,000 Units total) by mouth every 7 (seven) days. 08/29/22   McElwee, Jake Church, NP      Allergies    Patient has no known allergies.    Review of Systems   Review of Systems  HENT:  Positive for sore throat.   All other systems reviewed and are negative.   Physical Exam Updated Vital Signs BP 108/62   Pulse 85   Temp 98.1 F (36.7 C) (Oral)   Resp 18   Ht  (1.651 m)   Wt 104.3 kg   SpO2 100%   BMI 38.27 kg/m  Physical Exam Vitals and nursing note reviewed.  Constitutional:      Appearance: She is well-developed. She is obese.  HENT:     Head: Normocephalic and atraumatic.     Right Ear: Ear canal normal.     Left Ear: Ear canal normal.     Mouth/Throat:     Mouth: Mucous membranes are moist.     Pharynx: Posterior oropharyngeal erythema present.     Tonsils: No tonsillar exudate or tonsillar abscesses.  Eyes:     Conjunctiva/sclera: Conjunctivae normal.     Pupils: Pupils are equal, round, and reactive to light.  Cardiovascular:     Rate and Rhythm: Normal rate and regular rhythm.      Heart sounds: Normal heart sounds.  Pulmonary:     Effort: Pulmonary effort is normal.     Breath sounds: Normal breath sounds.  Abdominal:     General: Bowel sounds are normal.     Palpations: Abdomen is soft.  Musculoskeletal:     Cervical back: Normal range of motion.  Lymphadenopathy:     Cervical: Cervical adenopathy present.  Skin:    General: Skin is warm.     Capillary Refill: Capillary refill takes less than 2 seconds.  Neurological:     General: No focal deficit present.     Mental Status: She is alert and oriented to person, place, and time.  Psychiatric:        Mood and Affect: Mood normal.        Behavior: Behavior normal.     ED Results / Procedures / Treatments   Labs (all labs ordered are listed, but only abnormal results are displayed) Labs Reviewed  GROUP A STREP BY PCR  SARS CORONAVIRUS 2 BY RT PCR    EKG None  Radiology No results found.  Procedures Procedures    Medications Ordered in ED Medications  dexamethasone (DECADRON) injection 10 mg (  10 mg Intramuscular Given 12/17/22 2000)  ketorolac (TORADOL) 30 MG/ML injection 30 mg (30 mg Intramuscular Given 12/17/22 2001)  lidocaine (XYLOCAINE) 2 % viscous mouth solution 15 mL (15 mLs Mouth/Throat Given 12/17/22 1959)    ED Course/ Medical Decision Making/ A&P                             Medical Decision Making Risk Prescription drug management.   This patient presents to the ED for concern of sore throat, this involves an extensive number of treatment options, and is a complaint that carries with it a high risk of complications and morbidity.  The differential diagnosis includes covid/flu/rsv, strep, viral   Co morbidities that complicate the patient evaluation  migraines   Additional history obtained:  Additional history obtained from epic chart review  Lab Tests:  I Ordered, and personally interpreted labs.  The pertinent results include:  covid neg, strep neg  Cardiac  Monitoring:  The patient was maintained on a cardiac monitor.  I personally viewed and interpreted the cardiac monitored which showed an underlying rhythm of: nsr   Medicines ordered and prescription drug management:  I ordered medication including decadron/toradol  for sx  Reevaluation of the patient after these medicines showed that the patient improved I have reviewed the patients home medicines and have made adjustments as needed  Problem List / ED Course:  Pharyngitis:  likely viral.  Her throat is just a little red, so I don't think she needs abx for tonsillitis.  She is to return if worse.  F/u with pcp.   Reevaluation:  After the interventions noted above, I reevaluated the patient and found that they have :improved   Social Determinants of Health:  No insurance   Dispostion:  After consideration of the diagnostic results and the patients response to treatment, I feel that the patent would benefit from discharge with outpatient f/u.          Final Clinical Impression(s) / ED Diagnoses Final diagnoses:  Viral pharyngitis    Rx / DC Orders ED Discharge Orders     None         Jacalyn Lefevre, MD 12/17/22 2018

## 2022-12-17 NOTE — ED Triage Notes (Signed)
Patient arrives to ED POV C/O Sore throat x1 day.

## 2023-01-07 ENCOUNTER — Ambulatory Visit: Payer: Self-pay | Admitting: Dietician

## 2023-01-22 ENCOUNTER — Ambulatory Visit: Payer: Self-pay | Admitting: Dietician

## 2023-03-02 ENCOUNTER — Encounter: Payer: Medicaid Other | Admitting: Nurse Practitioner

## 2023-03-02 ENCOUNTER — Telehealth: Payer: Self-pay | Admitting: Nurse Practitioner

## 2023-03-02 NOTE — Telephone Encounter (Signed)
Pt NS on 7/1 no reason available letter sent 

## 2023-03-09 NOTE — Telephone Encounter (Signed)
Cancelled from DAR. Pt response of TEXT NO. 

## 2023-03-13 ENCOUNTER — Ambulatory Visit: Payer: Self-pay | Admitting: Dietician

## 2023-05-07 ENCOUNTER — Encounter: Payer: Self-pay | Admitting: Nurse Practitioner

## 2023-05-07 ENCOUNTER — Telehealth (INDEPENDENT_AMBULATORY_CARE_PROVIDER_SITE_OTHER): Payer: Medicaid Other | Admitting: Nurse Practitioner

## 2023-05-07 VITALS — Ht 65.0 in | Wt 225.0 lb

## 2023-05-07 DIAGNOSIS — M25512 Pain in left shoulder: Secondary | ICD-10-CM

## 2023-05-07 DIAGNOSIS — G8929 Other chronic pain: Secondary | ICD-10-CM | POA: Diagnosis not present

## 2023-05-07 MED ORDER — NAPROXEN 500 MG PO TABS: 500.0000 mg | ORAL_TABLET | Freq: Two times a day (BID) | ORAL | 0 refills | Status: AC

## 2023-05-07 NOTE — Patient Instructions (Signed)
It was great to see you!  Start icing your shoulder a few times a day for 5-10 minutes at a time.   Start naproxen twice a day with food for 1 week, then as needed  I have placed a referral to orthopedics, they will call to schedule.   Let's follow-up when able for routine follow-up, sooner if you have concerns.  If a referral was placed today, you will be contacted for an appointment. Please note that routine referrals can sometimes take up to 3-4 weeks to process. Please call our office if you haven't heard anything after this time frame.  Take care,  Rodman Pickle, NP

## 2023-05-07 NOTE — Progress Notes (Signed)
[facility] [address] [place] [address] Dept: [phone] Dept Fax: [phone]  Virtual Video Visit  I connected with Megan Frederick on 05/07/23 at  8:20 AM EDT by a video enabled telemedicine application and verified that I am speaking with the correct person using two identifiers.  Location patient: Home Location provider: Clinic Persons participating in the virtual visit: Patient; Rodman Pickle, NP; Malena Peer, CMA  I discussed the limitations of evaluation and management by telemedicine and the availability of in person appointments. The patient expressed understanding and agreed to proceed.  Chief Complaint  Patient presents with   Shoulder Pain    Left shoulder pain for 5-6 years and request an orthopedic referral    SUBJECTIVE:  HPI: Megan Frederick is a 52 y.o. female who presents with left shoulder pain.   She states that 2 months ago, she was packing and put a bag on her shoulder and started having pain. She notes that she has chronic shoulder pain since 2009. She always carried her books in her bag on her left shoulder. She states that even a bra strap on that shoulder hurts. She states that touching the shoulder hurts. She tries not to take medications, so as long as nothing touches it, it doesn't hurt. She states that she can move her arm without pain. She denies swelling, but states that it was a little red. She would like a referral to orthopedics.   Patient Active Problem List   Diagnosis Date Noted   Chronic left shoulder pain 05/07/2023   Prediabetes 08/28/2022   Impaired concentration 08/28/2022   Obesity (BMI 30-39.9) 08/28/2022   Palpitation 08/28/2022   Vitamin D deficiency 08/28/2022   Chronic rhinitis 11/21/2021   Recurrent infections 11/21/2021   Chronic cough 11/21/2021    Past Surgical History:  Procedure Laterality Date   BREAST BIOPSY     SPINE SURGERY      cyst on tailbone   TUBAL LIGATION      Family History  Problem Relation Age of Onset   Cancer Mother        lung   Hepatitis Father    Drug abuse Father    Breast cancer Sister    Asthma Daughter    Allergic rhinitis Daughter    Breast cancer Half-Sister 56   Breast cancer Half-Sister 44 - 100   Eczema Grandson    Asthma Grandson    Allergic rhinitis Grandson    Allergic rhinitis Granddaughter    Asthma Granddaughter     Social History   Tobacco Use   Smoking status: Never    Passive exposure: Never   Smokeless tobacco: Never  Vaping Use   Vaping status: Never Used  Substance Use Topics   Alcohol use: Not Currently   Drug use: Never     Current Outpatient Medications:    Acetaminophen (TYLENOL PO), Take by mouth as needed. Extra strength, Disp: , Rfl:    fluticasone (FLONASE) 50 MCG/ACT nasal spray, Place 2 sprays into both nostrils in the morning and at bedtime., Disp: 1 g, Rfl: 5   naproxen (NAPROSYN) 500 MG tablet, Take 1 tablet (500 mg total) by mouth 2 (two) times daily with a meal., Disp: 30 tablet, Rfl: 0   VITAMIN D PO, Take by mouth., Disp: , Rfl:    Vitamin D, Ergocalciferol, (DRISDOL) 1.25 MG (50000 UNIT) CAPS capsule, Take 1 capsule (50,000 Units total) by mouth every 7 (seven) days. (Patient  not taking: Reported on 05/07/2023), Disp: 12 capsule, Rfl: 0  No Known Allergies  ROS: See pertinent positives and negatives per HPI.  OBSERVATIONS/OBJECTIVE:  VITALS per patient if applicable: Today's Vitals   05/07/23 0813  Weight: 225 lb (102.1 kg)  Height: 5\' 5"  (1.651 m)   Body mass index is 37.44 kg/m.    GENERAL: Alert and oriented. Appears well and in no acute distress.  HEENT: Atraumatic. Conjunctiva clear. No obvious abnormalities on inspection of external nose and ears.  NECK: Normal movements of the head and neck.  LUNGS: On inspection, no signs of respiratory distress. Breathing rate appears normal. No obvious gross SOB, gasping or  wheezing, and no conversational dyspnea.  CV: No obvious cyanosis.  MS: Moves all visible extremities without noticeable abnormality.  PSYCH/NEURO: Pleasant and cooperative. No obvious depression or anxiety. Speech and thought processing grossly intact.  ASSESSMENT AND PLAN:  Problem List Items Addressed This Visit       Other   Chronic left shoulder pain - Primary    Chronic, not controlled. She started having left shoulder pain in 2009 when she was carrying heavy books in a bag on her left shoulder. It went away after finishing school, however it started again 2 months ago when she was moving. She currently can't put any weight on her left shoulder including a bra strap. She also gets pain when lifting anything. She has full ROM. Will have her start using ice a few times a day, start naproxen 500mg  BID with food for 1 week and then as needed. Will also place a referral to orthopedics per request. Follow-up if symptoms worsen or don't improve.       Relevant Medications   naproxen (NAPROSYN) 500 MG tablet   Other Relevant Orders   Ambulatory referral to Orthopedic Surgery     I discussed the assessment and treatment plan with the patient. The patient was provided an opportunity to ask questions and all were answered. The patient agreed with the plan and demonstrated an understanding of the instructions.   The patient was advised to call back or seek an in-person evaluation if the symptoms worsen or if the condition fails to improve as anticipated.   Gerre Scull, NP

## 2023-05-07 NOTE — Assessment & Plan Note (Signed)
Chronic, not controlled. She started having left shoulder pain in 2009 when she was carrying heavy books in a bag on her left shoulder. It went away after finishing school, however it started again 2 months ago when she was moving. She currently can't put any weight on her left shoulder including a bra strap. She also gets pain when lifting anything. She has full ROM. Will have her start using ice a few times a day, start naproxen 500mg  BID with food for 1 week and then as needed. Will also place a referral to orthopedics per request. Follow-up if symptoms worsen or don't improve.

## 2023-05-13 ENCOUNTER — Ambulatory Visit: Payer: Medicaid Other | Admitting: Orthopaedic Surgery

## 2023-05-14 ENCOUNTER — Ambulatory Visit (INDEPENDENT_AMBULATORY_CARE_PROVIDER_SITE_OTHER): Payer: Medicaid Other | Admitting: Orthopaedic Surgery

## 2023-05-14 ENCOUNTER — Encounter: Payer: Self-pay | Admitting: Orthopaedic Surgery

## 2023-05-14 ENCOUNTER — Other Ambulatory Visit (INDEPENDENT_AMBULATORY_CARE_PROVIDER_SITE_OTHER): Payer: Medicaid Other

## 2023-05-14 DIAGNOSIS — M25512 Pain in left shoulder: Secondary | ICD-10-CM | POA: Diagnosis not present

## 2023-05-14 DIAGNOSIS — G8929 Other chronic pain: Secondary | ICD-10-CM

## 2023-05-14 NOTE — Progress Notes (Signed)
Office Visit Note   Patient: Megan Frederick           Date of Birth: Jan 23, 1971           MRN: [id number] Visit Date: 05/14/2023              Requested by: Gerre Scull, NP [address] [place],  [place] [address] PCP: Gerre Scull, NP   Assessment & Plan: Visit Diagnoses:  1. Chronic left shoulder pain     Plan: Impression is symptomatic left AC joint arthritis.  No significant degenerative changes on x-ray yet.  We talked about modifying activity and clothing.  She would like to try Voltaren gel.  Currently not interested in cortisone injection.  Will see her back as needed.  Follow-Up Instructions: No follow-ups on file.   Orders:  Orders Placed This Encounter  Procedures   XR Shoulder Left   No orders of the defined types were placed in this encounter.     Procedures: No procedures performed   Clinical Data: No additional findings.   Subjective: Chief Complaint  Patient presents with   Left Shoulder - Pain    HPI Megan Frederick is a 52 year old female here for evaluation of chronic left shoulder pain for years.  She states that she has a history of carrying heavy bags over her shoulder and the strap tends to bother it.  Denies any specific injuries.  Some times the clothing or the bra strap can irritate.  Denies any radicular symptoms or neck problems. Review of Systems  Constitutional: Negative.   HENT: Negative.    Eyes: Negative.   Respiratory: Negative.    Cardiovascular: Negative.   Endocrine: Negative.   Musculoskeletal: Negative.   Neurological: Negative.   Hematological: Negative.   Psychiatric/Behavioral: Negative.    All other systems reviewed and are negative.    Objective: Vital Signs: There were no vitals taken for this visit.  Physical Exam Vitals and nursing note reviewed.  Constitutional:      Appearance: She is well-developed.  HENT:     Head: Atraumatic.     Nose: Nose normal.  Eyes:     Extraocular  Movements: Extraocular movements intact.  Cardiovascular:     Pulses: Normal pulses.  Pulmonary:     Effort: Pulmonary effort is normal.  Abdominal:     Palpations: Abdomen is soft.  Musculoskeletal:     Cervical back: Neck supple.  Skin:    General: Skin is warm.     Capillary Refill: Capillary refill takes less than 2 seconds.  Neurological:     Mental Status: She is alert. Mental status is at baseline.  Psychiatric:        Behavior: Behavior normal.        Thought Content: Thought content normal.        Judgment: Judgment normal.     Ortho Exam Examination of left shoulder shows no tenderness to palpation of the AC joint.  Negative cross body adduction.  Shoulder range of motion is normal.  Manual muscle testing of the rotator cuff is normal. Specialty Comments:  No specialty comments available.  Imaging: XR Shoulder Left  Result Date: 05/14/2023 X-rays of the left shoulder show no acute or structural abnormalities    PMFS History: Patient Active Problem List   Diagnosis Date Noted   Chronic left shoulder pain 05/07/2023   Prediabetes 08/28/2022   Impaired concentration 08/28/2022   Obesity (BMI 30-39.9) 08/28/2022   Palpitation 08/28/2022  Vitamin D deficiency 08/28/2022   Chronic rhinitis 11/21/2021   Recurrent infections 11/21/2021   Chronic cough 11/21/2021   Past Medical History:  Diagnosis Date   Allergy    Eczema    Migraine    Prediabetes     Family History  Problem Relation Age of Onset   Cancer Mother        lung   Hepatitis Father    Drug abuse Father    Breast cancer Sister    Asthma Daughter    Allergic rhinitis Daughter    Breast cancer Half-Sister 21   Breast cancer Half-Sister 25 - 43   Eczema Grandson    Asthma Grandson    Allergic rhinitis Grandson    Allergic rhinitis Granddaughter    Asthma Granddaughter     Past Surgical History:  Procedure Laterality Date   BREAST BIOPSY     SPINE SURGERY     cyst on tailbone   TUBAL  LIGATION     Social History   Occupational History   Not on file  Tobacco Use   Smoking status: Never    Passive exposure: Never   Smokeless tobacco: Never  Vaping Use   Vaping status: Never Used  Substance and Sexual Activity   Alcohol use: Not Currently   Drug use: Never   Sexual activity: Not Currently    Birth control/protection: Surgical

## 2023-05-18 ENCOUNTER — Emergency Department (HOSPITAL_BASED_OUTPATIENT_CLINIC_OR_DEPARTMENT_OTHER)
Admission: EM | Admit: 2023-05-18 | Discharge: 2023-05-18 | Disposition: A | Discharge status: Home / Self Care | Payer: Medicaid Other | Attending: Emergency Medicine | Admitting: Emergency Medicine

## 2023-05-18 ENCOUNTER — Encounter (HOSPITAL_BASED_OUTPATIENT_CLINIC_OR_DEPARTMENT_OTHER): Payer: Self-pay

## 2023-05-18 ENCOUNTER — Other Ambulatory Visit: Payer: Self-pay

## 2023-05-18 DIAGNOSIS — W57XXXA Bitten or stung by nonvenomous insect and other nonvenomous arthropods, initial encounter: Secondary | ICD-10-CM | POA: Diagnosis not present

## 2023-05-18 DIAGNOSIS — S80861A Insect bite (nonvenomous), right lower leg, initial encounter: Secondary | ICD-10-CM | POA: Insufficient documentation

## 2023-05-18 DIAGNOSIS — S80862A Insect bite (nonvenomous), left lower leg, initial encounter: Secondary | ICD-10-CM | POA: Insufficient documentation

## 2023-05-18 MED ORDER — CEFADROXIL 500 MG PO CAPS: 500.0000 mg | ORAL_CAPSULE | Freq: Two times a day (BID) | ORAL | 0 refills | Status: DC

## 2023-05-18 NOTE — ED Provider Notes (Signed)
[place] EMERGENCY DEPARTMENT AT [facility] Provider Note   CSN: [id number] Arrival date & time: 05/18/23  1334     History  Chief Complaint  Patient presents with   Insect Bite    Megan Frederick is a 52 y.o. female.  HPI   52 year old female presents emergency department with complaints of mosquito bites.  Patient states that she was outside and suffered multiple scutal bites to her lower extremities.  States that there is 1 area of concern on the inside of her right ankle that she continued to scratch and out it appears red, tender and warm to touch.  Denies any fever.  States that the other areas seem to be healing well.  Has taken no medication for her symptoms.  Past medical history significant for prediabetes, eczema, seasonal allergy  Home Medications Prior to Admission medications   Medication Sig Start Date End Date Taking? Authorizing Provider  cefadroxil (DURICEF) 500 MG capsule Take 1 capsule (500 mg total) by mouth 2 (two) times daily. 05/18/23  Yes Sherian Maroon A, PA  Acetaminophen (TYLENOL PO) Take by mouth as needed. Extra strength    [provider]  fluticasone (FLONASE) 50 MCG/ACT nasal spray Place 2 sprays into both nostrils in the morning and at bedtime. 05/27/22   Alfonse Spruce, MD  naproxen (NAPROSYN) 500 MG tablet Take 1 tablet (500 mg total) by mouth 2 (two) times daily with a meal. 05/07/23   McElwee, Lauren A, NP  VITAMIN D PO Take by mouth.    [provider]  Vitamin D, Ergocalciferol, (DRISDOL) 1.25 MG (50000 UNIT) CAPS capsule Take 1 capsule (50,000 Units total) by mouth every 7 (seven) days. 08/29/22   McElwee, Jake Church, NP      Allergies    Patient has no known allergies.    Review of Systems   Review of Systems  All other systems reviewed and are negative.   Physical Exam Updated Vital Signs BP 118/76 (BP Location: Right Arm)   Pulse 74   Temp 97.8 F (36.6 C)   Resp 16   SpO2 99%  Physical  Exam Vitals and nursing note reviewed.  Constitutional:      General: She is not in acute distress.    Appearance: She is well-developed.  HENT:     Head: Normocephalic and atraumatic.  Eyes:     Conjunctiva/sclera: Conjunctivae normal.  Cardiovascular:     Rate and Rhythm: Normal rate and regular rhythm.     Heart sounds: No murmur heard. Pulmonary:     Effort: Pulmonary effort is normal. No respiratory distress.     Breath sounds: Normal breath sounds. No wheezing, rhonchi or rales.  Abdominal:     Palpations: Abdomen is soft.     Tenderness: There is no abdominal tenderness.  Musculoskeletal:        General: No swelling.     Cervical back: Neck supple.  Skin:    General: Skin is warm and dry.     Capillary Refill: Capillary refill takes less than 2 seconds.     Comments: Patient with about nickel sized area of erythema on the inside aspect of her right ankle.  Area warm to palpation with tenderness.  Slightly indurated.  Bedside ultrasound showed cobblestoning of adipose tissue without evidence of clear abscess.  Neurological:     Mental Status: She is alert.  Psychiatric:        Mood and Affect: Mood normal.     ED  Results / Procedures / Treatments   Labs (all labs ordered are listed, but only abnormal results are displayed) Labs Reviewed - No data to display  EKG None  Radiology No results found.  Procedures Procedures    Medications Ordered in ED Medications - No data to display  ED Course/ Medical Decision Making/ A&P                                 Medical Decision Making Risk Prescription drug management.   This patient presents to the ED for concern of rash, this involves an extensive number of treatment options, and is a complaint that carries with it a high risk of complications and morbidity.  The differential diagnosis includes cellulitis, erysipelas, necrotizing infection, abscess, other   Co morbidities that complicate the patient  evaluation  See HPI   Additional history obtained:  Additional history obtained from EMR External records from outside source obtained and reviewed including hospital records   Lab Tests:  N/a   Imaging Studies ordered:  na   Cardiac Monitoring: / EKG:  The patient was maintained on a cardiac monitor.  I personally viewed and interpreted the cardiac monitored which showed an underlying rhythm of: Sinus rhythm   Consultations Obtained:  N/a   Problem List / ED Course / Critical interventions / Medication management  Cellulitis, insect bite Reevaluation of the patient showed that the patient stayed the same I have reviewed the patients home medicines and have made adjustments as needed   Social Determinants of Health:  Denies tobacco, licit drug use   Test / Admission - Considered:  Cellulitis, insect bite Vitals signs within normal range and stable throughout visit. 52 year old female presents emergency department with complaints of insect bite that developed into red, warm and tender area on her right inner ankle.  On exam, patient with area of cellulitis.  Bedside ultrasound did not show any obvious abscess.  Will place patient on antibiotics and have her follow-up with primary care for reassessment.  Would recommend antihistamines for itching sensation.  Treatment plan discussed at length with patient and she acknowledged understand was agreeable to said plan.  Patient well-appearing, afebrile in no acute distress. Worrisome signs and symptoms were discussed with the patient, and the patient acknowledged understanding to return to the ED if noticed. Patient was stable upon discharge.          Final Clinical Impression(s) / ED Diagnoses Final diagnoses:  Insect bite, unspecified site, initial encounter    Rx / DC Orders ED Discharge Orders          Ordered    cefadroxil (DURICEF) 500 MG capsule  2 times daily        05/18/23 1449               Peter Garter, [place] 05/18/23 1457    Benjiman Core, MD 05/18/23 1542

## 2023-05-18 NOTE — ED Triage Notes (Signed)
Pt c/o mosquito bites on BLE, swelling to BLE. States she cannot stop scratching, "but it hurts when I scratch, like that itchy pain." No meds PTA

## 2023-05-18 NOTE — Discharge Instructions (Signed)
As discussed, we will treat area with antibiotics given concern for skin infection called cellulitis.  Recommend treating of the itching with oral allergy medicine such as Zyrtec/Claritin/Benadryl.  Also recommend topical Benadryl cream to help with itching.  Please do not hesitate to return to emergency department if there are worrisome signs and symptoms we discussed become apparent.

## 2023-11-11 ENCOUNTER — Emergency Department (HOSPITAL_COMMUNITY)

## 2023-11-11 ENCOUNTER — Emergency Department (HOSPITAL_COMMUNITY)
Admission: EM | Admit: 2023-11-11 | Discharge: 2023-11-11 | Disposition: A | Discharge status: Home / Self Care | Attending: Emergency Medicine | Admitting: Emergency Medicine

## 2023-11-11 ENCOUNTER — Other Ambulatory Visit: Payer: Self-pay

## 2023-11-11 ENCOUNTER — Encounter (HOSPITAL_COMMUNITY): Payer: Self-pay | Admitting: Emergency Medicine

## 2023-11-11 DIAGNOSIS — R0789 Other chest pain: Secondary | ICD-10-CM | POA: Diagnosis not present

## 2023-11-11 DIAGNOSIS — R079 Chest pain, unspecified: Secondary | ICD-10-CM | POA: Diagnosis not present

## 2023-11-11 LAB — CBC
HCT: 35.7 % — ABNORMAL LOW (ref 36.0–46.0)
Hemoglobin: 12.1 g/dL (ref 12.0–15.0)
MCH: 29.7 pg (ref 26.0–34.0)
MCHC: 33.9 g/dL (ref 30.0–36.0)
MCV: 87.7 fL (ref 80.0–100.0)
Platelets: 222 10*3/uL (ref 150–400)
RBC: 4.07 MIL/uL (ref 3.87–5.11)
RDW: 14.3 % (ref 11.5–15.5)
WBC: 6.5 10*3/uL (ref 4.0–10.5)
nRBC: 0 % (ref 0.0–0.2)

## 2023-11-11 LAB — BASIC METABOLIC PANEL
Anion gap: 8 (ref 5–15)
BUN: 19 mg/dL (ref 6–20)
CO2: 21 mmol/L — ABNORMAL LOW (ref 22–32)
Calcium: 8.8 mg/dL — ABNORMAL LOW (ref 8.9–10.3)
Chloride: 108 mmol/L (ref 98–111)
Creatinine, Ser: 0.79 mg/dL (ref 0.44–1.00)
GFR, Estimated: >60 mL/min (ref >60)
Glucose, Bld: 95 mg/dL (ref 70–99)
Potassium: 3.7 mmol/L (ref 3.5–5.1)
Sodium: 137 mmol/L (ref 135–145)

## 2023-11-11 LAB — TROPONIN I (HIGH SENSITIVITY)
Troponin I (High Sensitivity): 4 ng/L (ref <18)
Troponin I (High Sensitivity): 3 ng/L (ref <18)

## 2023-11-11 NOTE — ED Provider Notes (Signed)
 [place] EMERGENCY DEPARTMENT AT [facility] Provider Note   CSN: [id number] Arrival date & time: 11/11/23  0145     History  Chief Complaint  Patient presents with   Chest Pain    Antwan Bugett Paulla Fore is a 53 y.o. female with past medical history significant for prediabetes, obesity presents with concern for left-sided arm pain radiating to jaw, left side chest.  Patient reports that she was lifting heavy boxes this morning and thinks that that may have been the cause.  She reports worse with movement or coughing.  She denies shortness of breath, dizziness, worse pain with exertion.  No previous history of ACS, hypertension, hyperlipidemia, stroke, tobacco use.  She rates no pain at rest, 3/10 pain with movement.   Chest Pain      Home Medications Prior to Admission medications   Medication Sig Start Date End Date Taking? Authorizing Provider  Acetaminophen (TYLENOL PO) Take by mouth as needed. Extra strength    [provider]  cefadroxil (DURICEF) 500 MG capsule Take 1 capsule (500 mg total) by mouth 2 (two) times daily. 05/18/23   Peter Garter, PA  fluticasone (FLONASE) 50 MCG/ACT nasal spray Place 2 sprays into both nostrils in the morning and at bedtime. 05/27/22   Alfonse Spruce, MD  naproxen (NAPROSYN) 500 MG tablet Take 1 tablet (500 mg total) by mouth 2 (two) times daily with a meal. 05/07/23   McElwee, Lauren A, NP  VITAMIN D PO Take by mouth.    [provider]  Vitamin D, Ergocalciferol, (DRISDOL) 1.25 MG (50000 UNIT) CAPS capsule Take 1 capsule (50,000 Units total) by mouth every 7 (seven) days. 08/29/22   McElwee, Jake Church, NP      Allergies    Patient has no known allergies.    Review of Systems   Review of Systems  Cardiovascular:  Positive for chest pain.  All other systems reviewed and are negative.   Physical Exam Updated Vital Signs BP 100/68   Pulse 62   Temp 97.6 F (36.4 C) (Oral)   Resp 15   Ht 5\' 5"   (1.651 m)   Wt 102.1 kg   SpO2 100%   BMI 37.44 kg/m  Physical Exam Vitals and nursing note reviewed.  Constitutional:      General: She is not in acute distress.    Appearance: Normal appearance.  HENT:     Head: Normocephalic and atraumatic.  Eyes:     General:        Right eye: No discharge.        Left eye: No discharge.  Cardiovascular:     Rate and Rhythm: Normal rate and regular rhythm.     Heart sounds: No murmur heard.    No friction rub. No gallop.  Pulmonary:     Effort: Pulmonary effort is normal.     Breath sounds: Normal breath sounds.  Chest:     Comments: No significant tenderness to palpation of the chest wall Abdominal:     General: Bowel sounds are normal.     Palpations: Abdomen is soft.  Skin:    General: Skin is warm and dry.     Capillary Refill: Capillary refill takes less than 2 seconds.  Neurological:     Mental Status: She is alert and oriented to person, place, and time.  Psychiatric:        Mood and Affect: Mood normal.        Behavior: Behavior normal.  ED Results / Procedures / Treatments   Labs (all labs ordered are listed, but only abnormal results are displayed) Labs Reviewed  BASIC METABOLIC PANEL - Abnormal; Notable for the following components:      Result Value   CO2 21 (*)    Calcium 8.8 (*)    All other components within normal limits  CBC - Abnormal; Notable for the following components:   HCT 35.7 (*)    All other components within normal limits  TROPONIN I (HIGH SENSITIVITY)  TROPONIN I (HIGH SENSITIVITY)    EKG None  Radiology DG Chest 2 View Result Date: 11/11/2023 CLINICAL DATA:  Chest pain EXAM: CHEST - 2 VIEW COMPARISON:  11/05/2021 FINDINGS: The heart size and mediastinal contours are within normal limits. Both lungs are clear. The visualized skeletal structures are unremarkable. IMPRESSION: No active cardiopulmonary disease. Electronically Signed   By: Deatra Robinson M.D.   On: 11/11/2023 02:56     Procedures Procedures    Medications Ordered in ED Medications - No data to display  ED Course/ Medical Decision Making/ A&P                                 Medical Decision Making  This patient is a 53 y.o. female  who presents to the ED for concern of chest pain.   Differential diagnoses prior to evaluation: The emergent differential diagnosis includes, but is not limited to,  ACS, AAS, PE, Mallory-Weiss, Boerhaave's, Pneumonia, acute bronchitis, asthma or COPD exacerbation, anxiety, MSK pain or traumatic injury to the chest, acid reflux versus other . This is not an exhaustive differential.   Past Medical History / Co-morbidities / Social History: Obesity, prediabetes  Additional history: Chart reviewed. Pertinent results include: Reviewed lab work, imaging from previous emergency department visits remotely, no previous cardiology evaluation previously on this patient  Physical Exam: Physical exam performed. The pertinent findings include: Vital signs stable in the emergency department, mild diastolic hypertension on arrival, blood pressure 118/96, repeat improved, 100/68 at time of discharge, not tachycardic, normal heart rate and rhythm, stable oxygen saturation on room air.  Lab Tests/Imaging studies: I personally interpreted labs/imaging and the pertinent results include: CBC unremarkable, BMP with very mild bicarb deficit, CO2 0.1, otherwise unremarkable, troponin is negative x 2 in context of atypical chest pain not worse with exertion with musculoskeletal pain as bleeding differential.  I independently interpreted plain film chest x-ray which shows no evidence of acute intrathoracic abnormality.. I agree with the radiologist interpretation.  Cardiac monitoring: EKG obtained and interpreted by myself and attending physician which shows: Normal sinus rhythm, no acute ST-T changes.   Medications: Encouraged ibuprofen, Tylenol as needed for musculoskeletal pain,  decreased heavy lifting until pain resolves.  Disposition: After consideration of the diagnostic results and the patients response to treatment, I feel that patient was suspected musculoskeletal chest pain, no evidence of acute ACS, low clinical suspicion for PE, patient is Wells criteria negative, no recent birth control use, recent travel, recent surgery.  Not tachycardic, not hypoxic.  Pain-free at time of discharge.   emergency department workup does not suggest an emergent condition requiring admission or immediate intervention beyond what has been performed at this time. The plan is: as above. The patient is safe for discharge and has been instructed to return immediately for worsening symptoms, change in symptoms or any other concerns.  Final Clinical Impression(s) / ED Diagnoses Final diagnoses:  Chest wall pain    Rx / DC Orders ED Discharge Orders     None         [place] 11/11/23 1610    Maia Plan, MD 11/12/23 [phone]

## 2023-11-11 NOTE — Discharge Instructions (Addendum)
 Please use Tylenol or ibuprofen for pain.  You may use 600 mg ibuprofen every 6 hours or 1000 mg of Tylenol every 6 hours.  You may choose to alternate between the 2.  This would be most effective.  Not to exceed 4 g of Tylenol within 24 hours.  Not to exceed 3200 mg ibuprofen 24 hours.  Please return if you have significantly worsening chest pain, shortness of breath despite treatment, follow-up with your primary care doctor.

## 2023-11-11 NOTE — ED Triage Notes (Addendum)
 Patient complaining of left side pain that goes to left arm that started yesterday morning. Patient reports lifting heavy boxes this morning and unsure if that is the cause. Denies shortness of breath or dizziness. Reports that the pain is worse with movement or coughing.

## 2023-11-26 ENCOUNTER — Ambulatory Visit (INDEPENDENT_AMBULATORY_CARE_PROVIDER_SITE_OTHER): Payer: Medicaid Other | Admitting: Nurse Practitioner

## 2023-11-26 VITALS — BP 116/82 | HR 77 | Temp 97.1°F | Ht 65.0 in | Wt 212.4 lb

## 2023-11-26 DIAGNOSIS — Z0000 Encounter for general adult medical examination without abnormal findings: Secondary | ICD-10-CM | POA: Diagnosis not present

## 2023-11-26 DIAGNOSIS — E669 Obesity, unspecified: Secondary | ICD-10-CM | POA: Diagnosis not present

## 2023-11-26 DIAGNOSIS — E559 Vitamin D deficiency, unspecified: Secondary | ICD-10-CM

## 2023-11-26 DIAGNOSIS — G8929 Other chronic pain: Secondary | ICD-10-CM

## 2023-11-26 DIAGNOSIS — R7303 Prediabetes: Secondary | ICD-10-CM | POA: Diagnosis not present

## 2023-11-26 DIAGNOSIS — Z23 Encounter for immunization: Secondary | ICD-10-CM

## 2023-11-26 DIAGNOSIS — M25512 Pain in left shoulder: Secondary | ICD-10-CM

## 2023-11-26 DIAGNOSIS — E7800 Pure hypercholesterolemia, unspecified: Secondary | ICD-10-CM | POA: Insufficient documentation

## 2023-11-26 LAB — LIPID PANEL
Cholesterol: 192 mg/dL (ref 0–200)
HDL: 48.6 mg/dL (ref >39.00)
LDL Cholesterol: 131 mg/dL — ABNORMAL HIGH (ref 0–99)
NonHDL: 143.37
Total CHOL/HDL Ratio: 4
Triglycerides: 61 mg/dL (ref 0.0–149.0)
VLDL: 12.2 mg/dL (ref 0.0–40.0)

## 2023-11-26 LAB — COMPREHENSIVE METABOLIC PANEL WITH GFR
ALT: 18 U/L (ref 0–35)
AST: 18 U/L (ref 0–37)
Albumin: 3.7 g/dL (ref 3.5–5.2)
Alkaline Phosphatase: 84 U/L (ref 39–117)
BUN: 13 mg/dL (ref 6–23)
CO2: 29 meq/L (ref 19–32)
Calcium: 8.8 mg/dL (ref 8.4–10.5)
Chloride: 104 meq/L (ref 96–112)
Creatinine, Ser: 0.71 mg/dL (ref 0.40–1.20)
GFR: 95.93 mL/min (ref >60.00)
Glucose, Bld: 83 mg/dL (ref 70–99)
Potassium: 4.3 meq/L (ref 3.5–5.1)
Sodium: 139 meq/L (ref 135–145)
Total Bilirubin: 0.5 mg/dL (ref 0.2–1.2)
Total Protein: 7 g/dL (ref 6.0–8.3)

## 2023-11-26 LAB — HEMOGLOBIN A1C: Hgb A1c MFr Bld: 5.9 % (ref 4.6–6.5)

## 2023-11-26 NOTE — Assessment & Plan Note (Signed)
Health maintenance reviewed and updated. Discussed nutrition, exercise. Follow-up 1 year.

## 2023-11-26 NOTE — Assessment & Plan Note (Signed)
Chronic, stable. Check CMP, CBC, lipid panel and treat based on results.

## 2023-11-26 NOTE — Patient Instructions (Signed)
 It was great to see you!  We are checking your labs today and will let you know the results via mychart/phone.   Start calcium/vitamin D supplement 2 tablets daily  Let's follow-up in 1 year, sooner if you have concerns.  If a referral was placed today, you will be contacted for an appointment. Please note that routine referrals can sometimes take up to 3-4 weeks to process. Please call our office if you haven't heard anything after this time frame.  Take care,  Rodman Pickle, NP

## 2023-11-26 NOTE — Assessment & Plan Note (Signed)
 Check vitamin D levels today and treat based on results. Can start calcium/vitamin D supplement daily.

## 2023-11-26 NOTE — Assessment & Plan Note (Signed)
 BMI 35.3.  She has lost 12 pounds recently by increasing activity at work. Discussed nutrition and exercise.

## 2023-11-26 NOTE — Assessment & Plan Note (Signed)
 Chronic, stable. She was told it is arthritis and has been doing better. Continue naproxen 500mg  BID prn pain.

## 2023-11-26 NOTE — Assessment & Plan Note (Signed)
 Chronic, stable. Check A1c and treat based on results.

## 2023-11-26 NOTE — Progress Notes (Signed)
 BP 116/82 (BP Location: Left Arm, Patient Position: Sitting, Cuff Size: Normal)   Pulse 77   Temp (!) 97.1 F (36.2 C)   Ht 5\' 5"  (1.651 m)   Wt 212 lb 6.4 oz (96.3 kg)   SpO2 98%   BMI 35.35 kg/m    Subjective:    Patient ID: Megan Frederick, female    DOB: Nov 27, 1970, 53 y.o.   MRN: [id number]  CC: Chief Complaint  Patient presents with   Annual Exam    With lab work-patient is not fasting, concerns with abdominal labs from ED visit on 11/11/23     HPI: Megan Frederick is a 53 y.o. female presenting on 11/26/2023 for comprehensive medical examination. Current medical complaints include:none  Depression and Anxiety Screen done today and results listed below:     11/26/2023   10:58 AM 05/07/2023    8:15 AM 10/07/2022    8:10 AM 08/28/2022    9:21 AM 08/13/2022    8:50 AM  Depression screen PHQ 2/9  Decreased Interest 0 0 0 0 0  Down, Depressed, Hopeless 0 0 0 0 0  PHQ - 2 Score 0 0 0 0 0  Altered sleeping 0 0  0   Tired, decreased energy 0 0  0   Change in appetite 0 0  0   Feeling bad or failure about yourself  0 0  0   Trouble concentrating 0 0  0   Moving slowly or fidgety/restless 0 0  0   Suicidal thoughts 0 0  0   PHQ-9 Score 0 0  0   Difficult doing work/chores Not difficult at all Not difficult at all  Not difficult at all       11/26/2023   10:58 AM 05/07/2023    8:16 AM 08/28/2022    9:21 AM  GAD 7 : Generalized Anxiety Score  Nervous, Anxious, on Edge 0 0 0  Control/stop worrying 0 0 0  Worry too much - different things 0 0 0  Trouble relaxing 0 0 0  Restless 0 0 0  Easily annoyed or irritable 0 0 0  Afraid - awful might happen 0 0 0  Total GAD 7 Score 0 0 0  Anxiety Difficulty Not difficult at all Not difficult at all Not difficult at all    The patient does not have a history of falls. I did not complete a risk assessment for falls. A plan of care for falls was not documented.   Past Medical History:  Past Medical History:   Diagnosis Date   Allergy    Arthritis May 13, 2022   Eczema    Migraine    Prediabetes     Surgical History:  Past Surgical History:  Procedure Laterality Date   BREAST BIOPSY     SPINE SURGERY     cyst on tailbone   TUBAL LIGATION  October 1997    Medications:  Current Outpatient Medications on File Prior to Visit  Medication Sig   Acetaminophen (TYLENOL PO) Take by mouth as needed. Extra strength (Patient not taking: Reported on 11/26/2023)   fluticasone (FLONASE) 50 MCG/ACT nasal spray Place 2 sprays into both nostrils in the morning and at bedtime. (Patient not taking: Reported on 11/26/2023)   naproxen (NAPROSYN) 500 MG tablet Take 1 tablet (500 mg total) by mouth 2 (two) times daily with a meal. (Patient not taking: Reported on 11/26/2023)   No current facility-administered medications on file prior to visit.  Allergies:  No Known Allergies  Social History:  Social History   Socioeconomic History   Marital status: Married    Spouse name: Not on file   Number of children: 4   Years of education: Not on file   Highest education level: Some college, no degree  Occupational History   Not on file  Tobacco Use   Smoking status: Never    Passive exposure: Never   Smokeless tobacco: Never  Vaping Use   Vaping status: Never Used  Substance and Sexual Activity   Alcohol use: Not Currently   Drug use: Never   Sexual activity: Not Currently    Birth control/protection: Abstinence, Post-menopausal, Surgical  Other Topics Concern   Not on file  Social History Narrative   Not on file   Social Drivers of Health   Financial Resource Strain: Low Risk  (11/26/2023)   Overall Financial Resource Strain (CARDIA)    Difficulty of Paying Living Expenses: Not very hard  Food Insecurity: Food Insecurity Present (11/26/2023)   Hunger Vital Sign    Worried About Running Out of Food in the Last Year: Sometimes true    Ran Out of Food in the Last Year: Never true   Transportation Needs: No Transportation Needs (11/26/2023)   PRAPARE - Administrator, Civil Service (Medical): No    Lack of Transportation (Non-Medical): No  Physical Activity: Insufficiently Active (11/26/2023)   Exercise Vital Sign    Days of Exercise per Week: 3 days    Minutes of Exercise per Session: 10 min  Stress: No Stress Concern Present (11/26/2023)   [employer] of Occupational Health - Occupational Stress Questionnaire    Feeling of Stress : Only a little  Social Connections: Moderately Integrated (11/26/2023)   Social Connection and Isolation Panel [NHANES]    Frequency of Communication with Friends and Family: More than three times a week    Frequency of Social Gatherings with Friends and Family: Patient declined    Attends Religious Services: 1 to 4 times per year    Active Member of [employer] or Organizations: Yes    Attends Engineer, structural: 1 to 4 times per year    Marital Status: Separated  Intimate Partner Violence: Not on file   Social History   Tobacco Use  Smoking Status Never   Passive exposure: Never  Smokeless Tobacco Never   Social History   Substance and Sexual Activity  Alcohol Use Not Currently    Family History:  Family History  Problem Relation Age of Onset   Cancer Mother        lung   Hepatitis Father    Drug abuse Father    Breast cancer Sister    Asthma Daughter    Allergic rhinitis Daughter    Breast cancer Half-Sister 93   Breast cancer Half-Sister 61 - 18   Eczema Grandson    Asthma Grandson    Allergic rhinitis Grandson    Allergic rhinitis Granddaughter    Asthma Granddaughter    Cancer Maternal Grandmother    Cancer Paternal Grandfather    ADD / ADHD Son    ADD / ADHD Son     Past medical history, surgical history, medications, allergies, family history and social history reviewed with patient today and changes made to appropriate areas of the chart.   Review of Systems  Constitutional:  Negative.   HENT: Negative.    Eyes: Negative.   Respiratory: Negative.    Cardiovascular: Negative.  Gastrointestinal: Negative.   Genitourinary: Negative.   Musculoskeletal: Negative.   Skin: Negative.   Neurological: Negative.   Psychiatric/Behavioral: Negative.     All other ROS negative except what is listed above and in the HPI.      Objective:    BP 116/82 (BP Location: Left Arm, Patient Position: Sitting, Cuff Size: Normal)   Pulse 77   Temp (!) 97.1 F (36.2 C)   Ht 5\' 5"  (1.651 m)   Wt 212 lb 6.4 oz (96.3 kg)   SpO2 98%   BMI 35.35 kg/m   Wt Readings from Last 3 Encounters:  11/26/23 212 lb 6.4 oz (96.3 kg)  11/11/23 225 lb (102.1 kg)  05/07/23 225 lb (102.1 kg)    Physical Exam Vitals and nursing note reviewed.  Constitutional:      General: She is not in acute distress.    Appearance: Normal appearance.  HENT:     Head: Normocephalic and atraumatic.     Right Ear: Tympanic membrane, ear canal and external ear normal.     Left Ear: Tympanic membrane, ear canal and external ear normal.     Mouth/Throat:     Mouth: Mucous membranes are moist.     Pharynx: No posterior oropharyngeal erythema.  Eyes:     Conjunctiva/sclera: Conjunctivae normal.  Cardiovascular:     Rate and Rhythm: Normal rate and regular rhythm.     Pulses: Normal pulses.     Heart sounds: Normal heart sounds.  Pulmonary:     Effort: Pulmonary effort is normal.     Breath sounds: Normal breath sounds.  Abdominal:     Palpations: Abdomen is soft.     Tenderness: There is no abdominal tenderness.  Musculoskeletal:        General: Normal range of motion.     Cervical back: Normal range of motion and neck supple.     Right lower leg: No edema.     Left lower leg: No edema.  Lymphadenopathy:     Cervical: No cervical adenopathy.  Skin:    General: Skin is warm and dry.  Neurological:     General: No focal deficit present.     Mental Status: She is alert and oriented to person,  place, and time.     Cranial Nerves: No cranial nerve deficit.     Coordination: Coordination normal.     Gait: Gait normal.  Psychiatric:        Mood and Affect: Mood normal.        Behavior: Behavior normal.        Thought Content: Thought content normal.        Judgment: Judgment normal.     Results for orders placed or performed during the hospital encounter of 11/11/23  Basic metabolic panel   Collection Time: 11/11/23  2:08 AM  Result Value Ref Range   Sodium 137 135 - 145 mmol/L   Potassium 3.7 3.5 - 5.1 mmol/L   Chloride 108 98 - 111 mmol/L   CO2 21 (L) 22 - 32 mmol/L   Glucose, Bld 95 70 - 99 mg/dL   BUN 19 6 - 20 mg/dL   Creatinine, Ser 0.86 0.44 - 1.00 mg/dL   Calcium 8.8 (L) 8.9 - 10.3 mg/dL   GFR, Estimated >57 >84 mL/min   Anion gap 8 5 - 15  CBC   Collection Time: 11/11/23  2:08 AM  Result Value Ref Range   WBC 6.5 4.0 - 10.5 K/uL  RBC 4.07 3.87 - 5.11 MIL/uL   Hemoglobin 12.1 12.0 - 15.0 g/dL   HCT 16.1 (L) 09.6 - 04.5 %   MCV 87.7 80.0 - 100.0 fL   MCH 29.7 26.0 - 34.0 pg   MCHC 33.9 30.0 - 36.0 g/dL   RDW 40.9 81.1 - 91.4 %   Platelets 222 150 - 400 K/uL   nRBC 0.0 0.0 - 0.2 %  Troponin I (High Sensitivity)   Collection Time: 11/11/23  2:08 AM  Result Value Ref Range   Troponin I (High Sensitivity) 4 <18 ng/L  Troponin I (High Sensitivity)   Collection Time: 11/11/23  3:56 AM  Result Value Ref Range   Troponin I (High Sensitivity) 3 <18 ng/L      Assessment & Plan:   Problem List Items Addressed This Visit       Other   Prediabetes   Chronic, stable. Check A1c and treat based on results.       Relevant Orders   Hemoglobin A1c   Obesity (BMI 30-39.9)   BMI 35.3.  She has lost 12 pounds recently by increasing activity at work. Discussed nutrition and exercise.      Vitamin D deficiency   Check vitamin D levels today and treat based on results. Can start calcium/vitamin D supplement daily.       Relevant Orders   VITAMIN D 25  Hydroxy (Vit-D Deficiency, Fractures)   Chronic left shoulder pain   Chronic, stable. She was told it is arthritis and has been doing better. Continue naproxen 500mg  BID prn pain.       Pure hypercholesterolemia   Chronic, stable. Check CMP, CBC, lipid panel and treat based on results.      Relevant Orders   Lipid panel   Comprehensive metabolic panel with GFR   Routine general medical examination at a health care facility - Primary   Health maintenance reviewed and updated. Discussed nutrition, exercise. Follow-up 1 year.        Other Visit Diagnoses       Immunization due       Tdap given today   Relevant Orders   Tdap vaccine greater than or equal to 7yo IM (Completed)        Follow up plan: Return in about 1 year (around 11/25/2024) for CPE.   LABORATORY TESTING:  - Pap smear:  will schedule with GYN  IMMUNIZATIONS:   - Tdap: Tetanus vaccination status reviewed: Td vaccination indicated and given today. - Influenza: Declined - Pneumovax: Not applicable - Prevnar: Up to date - HPV: Not applicable - Shingrix vaccine: Declined  SCREENING: -Mammogram: Up to date  - Colonoscopy: Up to date  - Bone Density: Not applicable   PATIENT COUNSELING:   Advised to take 1 mg of folate supplement per day if capable of pregnancy.   Sexuality: Discussed sexually transmitted diseases, partner selection, use of condoms, avoidance of unintended pregnancy  and contraceptive alternatives.   Advised to avoid cigarette smoking.  I discussed with the patient that most people either abstain from alcohol or drink within safe limits (<=14/week and <=4 drinks/occasion for males, <=7/weeks and <= 3 drinks/occasion for females) and that the risk for alcohol disorders and other health effects rises proportionally with the number of drinks per week and how often a drinker exceeds daily limits.  Discussed cessation/primary prevention of drug use and availability of treatment for abuse.    Diet: Encouraged to adjust caloric intake to maintain  or achieve ideal body  weight, to reduce intake of dietary saturated fat and total fat, to limit sodium intake by avoiding high sodium foods and not adding table salt, and to maintain adequate dietary potassium and calcium preferably from fresh fruits, vegetables, and low-fat dairy products.    stressed the importance of regular exercise  Injury prevention: Discussed safety belts, safety helmets, smoke detector, smoking near bedding or upholstery.   Dental health: Discussed importance of regular tooth brushing, flossing, and dental visits.    NEXT PREVENTATIVE PHYSICAL DUE IN 1 YEAR. Return in about 1 year (around 11/25/2024) for CPE.  Dylana Shaw A Brienne Liguori

## 2023-11-27 ENCOUNTER — Encounter: Payer: Self-pay | Admitting: Nurse Practitioner

## 2023-11-27 LAB — VITAMIN D 25 HYDROXY (VIT D DEFICIENCY, FRACTURES): VITD: 22.33 ng/mL — ABNORMAL LOW (ref 30.00–100.00)

## 2024-02-29 ENCOUNTER — Emergency Department (HOSPITAL_COMMUNITY)
Admission: EM | Admit: 2024-02-29 | Discharge: 2024-03-01 | Disposition: A | Discharge status: Home / Self Care | Attending: Emergency Medicine | Admitting: Emergency Medicine

## 2024-02-29 ENCOUNTER — Other Ambulatory Visit: Payer: Self-pay

## 2024-02-29 ENCOUNTER — Emergency Department (HOSPITAL_COMMUNITY)

## 2024-02-29 ENCOUNTER — Encounter (HOSPITAL_COMMUNITY): Payer: Self-pay | Admitting: Emergency Medicine

## 2024-02-29 DIAGNOSIS — S8392XA Sprain of unspecified site of left knee, initial encounter: Secondary | ICD-10-CM | POA: Insufficient documentation

## 2024-02-29 DIAGNOSIS — W230XXA Caught, crushed, jammed, or pinched between moving objects, initial encounter: Secondary | ICD-10-CM | POA: Diagnosis not present

## 2024-02-29 DIAGNOSIS — M25562 Pain in left knee: Secondary | ICD-10-CM | POA: Diagnosis not present

## 2024-02-29 DIAGNOSIS — M79605 Pain in left leg: Secondary | ICD-10-CM | POA: Diagnosis not present

## 2024-02-29 NOTE — ED Provider Notes (Signed)
 [facility] DEPARTMENT AT [facility] Provider Note   CSN: [id number] Arrival date & time: 02/29/24  2152     History Chief Complaint  Patient presents with   Knee Pain    HPI Megan Frederick is a 53 y.o. female presenting for left leg pain. Leg is painful and swollen. States that she was on a bus traveling from [place] to [facility]  Patient's recorded medical, surgical, social, medication list and allergies were reviewed in the Snapshot window as part of the initial history.   Review of Systems   Review of Systems  Constitutional:  Negative for chills and fever.  HENT:  Negative for ear pain and sore throat.   Eyes:  Negative for pain and visual disturbance.  Respiratory:  Negative for cough and shortness of breath.   Cardiovascular:  Positive for leg swelling. Negative for chest pain and palpitations.  Gastrointestinal:  Negative for abdominal pain and vomiting.  Genitourinary:  Negative for dysuria and hematuria.  Musculoskeletal:  Positive for gait problem. Negative for arthralgias and back pain.  Skin:  Negative for color change and rash.  Neurological:  Negative for seizures and syncope.  All other systems reviewed and are negative.   Physical Exam Updated Vital Signs BP 126/85 (BP Location: Right Arm)   Pulse 66   Temp 98 F (36.7 C) (Oral)   Resp 17   SpO2 100%  Physical Exam Vitals and nursing note reviewed.  Constitutional:      General: She is not in acute distress.    Appearance: She is well-developed.  HENT:     Head: Normocephalic and atraumatic.   Eyes:     Conjunctiva/sclera: Conjunctivae normal.    Cardiovascular:     Rate and Rhythm: Normal rate and regular rhythm.     Heart sounds: No murmur heard. Pulmonary:     Effort: Pulmonary effort is normal. No respiratory distress.     Breath sounds: Normal breath sounds.  Abdominal:     General: There is no distension.     Palpations: Abdomen is soft.     Tenderness: There is  no abdominal tenderness. There is no right CVA tenderness or left CVA tenderness.   Musculoskeletal:        General: Tenderness present. No swelling. Normal range of motion.     Cervical back: Neck supple.   Skin:    General: Skin is warm and dry.   Neurological:     General: No focal deficit present.     Mental Status: She is alert and oriented to person, place, and time. Mental status is at baseline.     Cranial Nerves: No cranial nerve deficit.      ED Course/ Medical Decision Making/ A&P    Procedures Procedures   Medications Ordered in ED Medications - No data to display  Medical Decision Making:   Megan Frederick is a 53 y.o. female who presented to the ED today with left leg pain and swelling detailed above.    Complete initial physical exam performed, notably the patient  was HDS.    Reviewed and confirmed nursing documentation for past medical history, family history, social history.    Initial Assessment:   With the patient's presentation of left leg swelling, most likely diagnosis is nonspecific etiology. Other diagnoses were considered including (but not limited to) DVT vs MSK. These are considered less likely due to history of present illness and physical exam findings.   This is most consistent  with an acute life/limb threatening illness complicated by underlying chronic conditions.  Initial Plan:  XR to rule out fracture Initial Study Results:   Radiology:  All images reviewed independently. Agree with radiology report at this time.   DG Knee Complete 4 Views Left Result Date: 02/29/2024 CLINICAL DATA:  Twisting injury yesterday with leg pain, initial encounter EXAM: LEFT KNEE - COMPLETE 4+ VIEW COMPARISON:  None Available. FINDINGS: No evidence of fracture, dislocation, or joint effusion. No evidence of arthropathy or other focal bone abnormality. Soft tissues are unremarkable. IMPRESSION: No acute abnormality noted. Electronically Signed   By: Megan Frederick M.D.   On: 02/29/2024 22:33   Reassessment and Plan:   Single dose eliquis and return.     ***  Clinical Impression: No diagnosis found.   Data Unavailable   Final Clinical Impression(s) / ED Diagnoses Final diagnoses:  None    Rx / DC Orders ED Discharge Orders     None

## 2024-02-29 NOTE — ED Triage Notes (Signed)
 Pt presents with left knee pain. States she rode the bus home from [place]  last night and when she got up to get off of the bus at a stop she caught her leg on her bag and felt something get injured.  Limping in triage.  Reports she did go to work today but had to leave early d/t pain.

## 2024-03-01 ENCOUNTER — Ambulatory Visit (HOSPITAL_COMMUNITY)
Admission: RE | Admit: 2024-03-01 | Discharge: 2024-03-01 | Disposition: A | Discharge status: Home / Self Care | Source: Ambulatory Visit | Attending: Vascular Surgery | Admitting: Vascular Surgery

## 2024-03-01 DIAGNOSIS — M79662 Pain in left lower leg: Secondary | ICD-10-CM

## 2024-03-01 MED ORDER — APIXABAN 2.5 MG PO TABS
10.0000 mg | ORAL_TABLET | Freq: Once | ORAL | Status: AC
  Administered 2024-03-01: 10 mg via ORAL
  Filled 2024-03-01: qty 4

## 2024-03-01 MED ORDER — ACETAMINOPHEN 500 MG PO TABS
1000.0000 mg | ORAL_TABLET | Freq: Once | ORAL | Status: DC
  Filled 2024-03-01: qty 2

## 2024-03-01 NOTE — ED Notes (Signed)
 Patient needs eliquis and it is not loaded in the pyxis. Pharmacy notified.

## 2024-03-01 NOTE — ED Notes (Addendum)
 Patient d/c with home care instructions. Offered patient a wheelchair to be d/c'd and patient refused

## 2024-08-29 ENCOUNTER — Encounter (HOSPITAL_BASED_OUTPATIENT_CLINIC_OR_DEPARTMENT_OTHER): Payer: Self-pay | Admitting: Emergency Medicine

## 2024-08-29 ENCOUNTER — Other Ambulatory Visit: Payer: Self-pay

## 2024-08-29 ENCOUNTER — Emergency Department (HOSPITAL_BASED_OUTPATIENT_CLINIC_OR_DEPARTMENT_OTHER): Admitting: Radiology

## 2024-08-29 DIAGNOSIS — W010XXA Fall on same level from slipping, tripping and stumbling without subsequent striking against object, initial encounter: Secondary | ICD-10-CM | POA: Diagnosis not present

## 2024-08-29 DIAGNOSIS — S7011XA Contusion of right thigh, initial encounter: Secondary | ICD-10-CM | POA: Diagnosis not present

## 2024-08-29 DIAGNOSIS — Y92009 Unspecified place in unspecified non-institutional (private) residence as the place of occurrence of the external cause: Secondary | ICD-10-CM | POA: Insufficient documentation

## 2024-08-29 DIAGNOSIS — S80212A Abrasion, left knee, initial encounter: Secondary | ICD-10-CM | POA: Diagnosis not present

## 2024-08-29 DIAGNOSIS — M25561 Pain in right knee: Secondary | ICD-10-CM | POA: Diagnosis not present

## 2024-08-29 DIAGNOSIS — S80211A Abrasion, right knee, initial encounter: Secondary | ICD-10-CM | POA: Diagnosis not present

## 2024-08-29 DIAGNOSIS — M25511 Pain in right shoulder: Secondary | ICD-10-CM | POA: Diagnosis not present

## 2024-08-29 NOTE — ED Triage Notes (Addendum)
 Patient c/o right knee pain and swelling after missing a step and falling at her home. Patient has bruising and swelling to right knee along with abrasion.  Patient also endorses right upper arm pain.  Bleeding controlled.  Patient denies head trauma and denies blood thinners.

## 2024-08-29 NOTE — ED Provider Triage Note (Signed)
 Emergency Medicine Provider Triage Evaluation Note  Megan Frederick , a 53 y.o. female  was evaluated in triage.  Pt complains of mechanical fall. Right knee and right shoulder pain. Denies head trauma, LOC, seizure, blood thinners.  Review of Systems  Positive:  Negative:   Physical Exam  BP (!) 110/49   Pulse 72   Temp 98 F (36.7 C)   Resp 18   Wt 101.2 kg   SpO2 100%   BMI 37.11 kg/m  Gen:   Awake, no distress   Resp:  Normal effort  MSK:   Moves extremities without difficulty  Other:    Medical Decision Making  Medically screening exam initiated at 8:31 PM.  Appropriate orders placed.  Megan Frederick was informed that the remainder of the evaluation will be completed by another provider, this initial triage assessment does not replace that evaluation, and the importance of remaining in the ED until their evaluation is complete.     Megan Frederick, [place] 08/29/24 2032

## 2024-08-30 ENCOUNTER — Emergency Department (HOSPITAL_BASED_OUTPATIENT_CLINIC_OR_DEPARTMENT_OTHER)
Admission: EM | Admit: 2024-08-30 | Discharge: 2024-08-30 | Disposition: A | Discharge status: Home / Self Care | Attending: Emergency Medicine | Admitting: Emergency Medicine

## 2024-08-30 DIAGNOSIS — S7011XA Contusion of right thigh, initial encounter: Secondary | ICD-10-CM

## 2024-08-30 DIAGNOSIS — S80211A Abrasion, right knee, initial encounter: Secondary | ICD-10-CM

## 2024-08-30 DIAGNOSIS — W19XXXA Unspecified fall, initial encounter: Secondary | ICD-10-CM

## 2024-08-30 DIAGNOSIS — M25561 Pain in right knee: Secondary | ICD-10-CM

## 2024-08-30 NOTE — Discharge Instructions (Addendum)
 Your x-ray imaging was negative for acute fracture or dislocation.  You have been placed in an Ace wrap for comfort.  Your tetanus is up-to-date.  Recommend Tylenol  and ibuprofen for pain control.

## 2024-08-30 NOTE — ED Provider Notes (Signed)
 "  EMERGENCY DEPARTMENT AT [facility] Provider Note   CSN: [id number] Arrival date & time: 08/29/24  2005     Patient presents with: Megan Frederick is a 53 y.o. female.    Fall     53 year old female presenting to the emergency department after a ground-level mechanical fall.  The patient states that she tripped landing on her right shoulder and right knee.  She denies head trauma loss of consciousness.  She denies any use of anticoagulants.  She states that she skinned her right knee and has been having pain in the right knee and right shoulder.  Her tetanus is up-to-date.  She arrives GCS 15, ABC intact.  Prior to Admission medications  Medication Sig Start Date End Date Taking? Authorizing Provider  Acetaminophen  (TYLENOL  PO) Take by mouth as needed. Extra strength Patient not taking: Reported on 11/26/2023    [provider]  fluticasone  (FLONASE ) 50 MCG/ACT nasal spray Place 2 sprays into both nostrils in the morning and at bedtime. Patient not taking: Reported on 11/26/2023 05/27/22   Iva Marty Saltness, MD  naproxen  (NAPROSYN ) 500 MG tablet Take 1 tablet (500 mg total) by mouth 2 (two) times daily with a meal. Patient not taking: Reported on 11/26/2023 05/07/23   Nedra Tinnie LABOR, NP    Allergies: Patient has no known allergies.    Review of Systems  All other systems reviewed and are negative.   Updated Vital Signs BP 115/60   Pulse 81   Temp 98 F (36.7 C)   Resp 20   Wt 101.2 kg   SpO2 100%   BMI 37.11 kg/m   Physical Exam Vitals and nursing note reviewed.  Constitutional:      General: She is not in acute distress.    Appearance: She is well-developed.     Comments: GCS 15, ABC intact  HENT:     Head: Normocephalic and atraumatic.  Eyes:     Extraocular Movements: Extraocular movements intact.     Conjunctiva/sclera: Conjunctivae normal.     Pupils: Pupils are equal, round, and reactive to light.  Neck:      Comments: No midline tenderness to palpation of the cervical spine.  Range of motion intact Cardiovascular:     Rate and Rhythm: Normal rate and regular rhythm.     Heart sounds: No murmur heard. Pulmonary:     Effort: Pulmonary effort is normal. No respiratory distress.     Breath sounds: Normal breath sounds.  Chest:     Comments: Clavicles stable nontender to AP compression.  Chest wall stable and nontender to AP and lateral compression. Abdominal:     Palpations: Abdomen is soft.     Tenderness: There is no abdominal tenderness.     Comments: Pelvis stable to lateral compression  Musculoskeletal:     Cervical back: Neck supple.     Comments: No midline tenderness to palpation of the thoracic or lumbar spine.  Extremities atraumatic with intact range of motion with the exception of bilateral abrasions to the knees, right greater than left, range of motion of the knee intact, 2+ distal pulses, intact range of motion passively and actively, patient able to bear weight and ambulate, negative pain with valgus or varus stress, negative anterior drawer.  Skin:    General: Skin is warm and dry.  Neurological:     Mental Status: She is alert.     Comments: Cranial nerves II through XII grossly intact.  Moving all 4 extremities spontaneously.  Sensation grossly intact all 4 extremities     (all labs ordered are listed, but only abnormal results are displayed) Labs Reviewed - No data to display  EKG: None  Radiology: DG Knee 2 Views Right Result Date: 08/29/2024 CLINICAL DATA:  Pain after fall. EXAM: RIGHT KNEE - 1-2 VIEW COMPARISON:  None Available. FINDINGS: Lateral view is limited by obliquity. No evidence of fracture, dislocation, or joint effusion. No evidence of arthropathy or other focal bone abnormality. Soft tissues are unremarkable. IMPRESSION: No fracture or dislocation of the right knee. Electronically Signed   By: Andrea Gasman M.D.   On: 08/29/2024 22:07   DG Shoulder  Right Result Date: 08/29/2024 CLINICAL DATA:  Pain after fall. EXAM: RIGHT SHOULDER - 2+ VIEW COMPARISON:  None Available. FINDINGS: There is no evidence of fracture or dislocation. The alignment and joint spaces are normal. There is no evidence of arthropathy or other focal bone abnormality. Soft tissues are unremarkable. IMPRESSION: Negative radiographs of the right shoulder. Electronically Signed   By: Andrea Gasman M.D.   On: 08/29/2024 22:07     Procedures   Medications Ordered in the ED - No data to display                                  Medical Decision Making   53 year old female presenting to the emergency department after a ground-level mechanical fall.  The patient states that she tripped landing on her right shoulder and right knee.  She denies head trauma loss of consciousness.  She denies any use of anticoagulants.  She states that she skinned her right knee and has been having pain in the right knee and right shoulder.  Her tetanus is up-to-date.  She arrives GCS 15, ABC intact.  On arrival, the patient was vitally stable.  On exam the patient had a reassuring trauma exam. Physical Exam:No midline tenderness to palpation of the thoracic or lumbar spine.  Extremities atraumatic with intact range of motion with the exception of bilateral abrasions to the knees, right greater than left, range of motion of the knee intact, 2+ distal pulses, intact range of motion passively and actively, patient able to bear weight and ambulate, negative pain with valgus or varus stress, negative anterior drawer.  X-ray imaging of the right knee and right shoulder was negative for acute fracture or dislocation.  Patient was provided with an Ace wrap for comfort, ambulatory in the emergency department without assistance.  Overall stable for discharge at this time and outpatient follow-up, advised rest, ice, elevation of the extremity, NSAIDs for pain control.     Final diagnoses:  Fall, initial  encounter  Abrasion of right knee, initial encounter  Acute pain of right knee  Hematoma of right thigh, initial encounter    ED Discharge Orders     None          Jerrol Agent, MD 08/30/24 2035  "
